# Patient Record
Sex: Male | Born: 1960 | Race: Black or African American | Hispanic: No | Marital: Single | Smoking: Current some day smoker
Health system: Southern US, Community
[De-identification: ages and names within clinical notes are randomized; demographics above are authoritative.]

## PROBLEM LIST (undated history)

## (undated) DIAGNOSIS — I10 Essential (primary) hypertension: Secondary | ICD-10-CM

## (undated) HISTORY — DX: Morbid (severe) obesity due to excess calories: E66.01

## (undated) HISTORY — PX: ANKLE FUSION: SHX881

## (undated) HISTORY — PX: FOOT BONE EXCISION: SUR493

---

## 1999-08-04 ENCOUNTER — Emergency Department (HOSPITAL_COMMUNITY): Admission: EM | Admit: 1999-08-04 | Discharge: 1999-08-05 | Payer: Self-pay | Admitting: Emergency Medicine

## 1999-08-04 ENCOUNTER — Encounter: Payer: Self-pay | Admitting: Emergency Medicine

## 2004-01-09 ENCOUNTER — Emergency Department (HOSPITAL_COMMUNITY): Admission: EM | Admit: 2004-01-09 | Discharge: 2004-01-09 | Payer: Self-pay | Admitting: Emergency Medicine

## 2006-01-02 ENCOUNTER — Encounter: Admission: RE | Admit: 2006-01-02 | Discharge: 2006-01-02 | Payer: Self-pay | Admitting: Occupational Medicine

## 2007-01-21 ENCOUNTER — Emergency Department (HOSPITAL_COMMUNITY): Admission: EM | Admit: 2007-01-21 | Discharge: 2007-01-21 | Payer: Self-pay | Admitting: Emergency Medicine

## 2014-06-16 ENCOUNTER — Emergency Department (INDEPENDENT_AMBULATORY_CARE_PROVIDER_SITE_OTHER)
Admission: EM | Admit: 2014-06-16 | Discharge: 2014-06-16 | Disposition: A | Discharge status: Home / Self Care | Payer: Self-pay | Source: Home / Self Care | Attending: Emergency Medicine | Admitting: Emergency Medicine

## 2014-06-16 ENCOUNTER — Encounter (HOSPITAL_COMMUNITY): Payer: Self-pay | Admitting: Emergency Medicine

## 2014-06-16 DIAGNOSIS — L209 Atopic dermatitis, unspecified: Secondary | ICD-10-CM

## 2014-06-16 DIAGNOSIS — L2089 Other atopic dermatitis: Secondary | ICD-10-CM

## 2014-06-16 DIAGNOSIS — I10 Essential (primary) hypertension: Secondary | ICD-10-CM

## 2014-06-16 HISTORY — DX: Essential (primary) hypertension: I10

## 2014-06-16 LAB — POCT I-STAT, CHEM 8
BUN: 15 mg/dL (ref 6–23)
Calcium, Ion: 1.2 mmol/L (ref 1.12–1.23)
Chloride: 106 mEq/L (ref 96–112)
Creatinine, Ser: 1 mg/dL (ref 0.50–1.35)
Glucose, Bld: 101 mg/dL — ABNORMAL HIGH (ref 70–99)
HCT: 46 % (ref 39.0–52.0)
Hemoglobin: 15.6 g/dL (ref 13.0–17.0)
Potassium: 4 mEq/L (ref 3.7–5.3)
Sodium: 139 mEq/L (ref 137–147)
TCO2: 24 mmol/L (ref 0–100)

## 2014-06-16 MED ORDER — TRIAMCINOLONE ACETONIDE 0.1 % EX CREA: 1.0000 "application " | TOPICAL_CREAM | Freq: Three times a day (TID) | CUTANEOUS | Status: DC

## 2014-06-16 MED ORDER — FUROSEMIDE 20 MG PO TABS: 20.0000 mg | ORAL_TABLET | Freq: Every day | ORAL | Status: DC

## 2014-06-16 MED ORDER — LISINOPRIL-HYDROCHLOROTHIAZIDE 20-25 MG PO TABS: 1.0000 | ORAL_TABLET | Freq: Every day | ORAL | Status: DC

## 2014-06-16 NOTE — ED Provider Notes (Signed)
Chief Complaint   Chief Complaint  Patient presents with  . Medication Refill    History of Present Illness   Cody [name] is a 53 year old male who presented today for refill on blood pressure medications. He is on furosemide 20 mg a day, lisinopril/HCTZ 20/25 once a day and then Tenormin. He has had high blood pressure for about a year. He notes dizziness and swelling of his ankles. He denies headaches, blurry vision, shortness of breath, or chest pain. He's had no strokelike symptoms. He denies any history of diabetes, kidney failure, heart attack, or stroke. He also has eczema in his axillas, groins, and behind his knees. He like a prescription for cream for this. The areas are very itchy.  Review of Systems   Other than as noted above, the patient denies any of the following symptoms: Respiratory:  No coughing, wheezing, or shortness of breath. Cardiac:  No chest pain, tightness, pressure, palpitations, syncope, or edema. Neuro:  No headache, dizziness, blurred vision, weakness, paresthesias, or strokelike symptoms.   PMFSH   Past medical history, family history, social history, meds, and allergies were reviewed.    Physical Examination   Vital signs:  BP 131/83  Pulse 67  Temp(Src) 98.5 F (36.9 C) (Oral)  Resp 18  SpO2 98% General:  Alert, oriented, in no distress. Lungs:  Breath sounds clear and equal bilaterally.  No wheezes, rales, or rhonchi. Heart:  Regular rhythm, no gallops, murmers, clicks or rubs.  Abdomen:  Soft and flat.  Nontender, no organomegaly or mass.  No pulsatile midline abdominal mass or bruit. Ext:  No edema, pulses full. Neurological exam:  Alert and oriented.  Speech is clear.  No pronator drift.  CNs intact.  Labs   Results for orders placed during the hospital encounter of 06/16/14  POCT I-STAT, CHEM 8      Result Value Ref Range   Sodium 139  137 - 147 mEq/L   Potassium 4.0  3.7 - 5.3 mEq/L   Chloride 106  96 - 112 mEq/L   BUN 15  6  - 23 mg/dL   Creatinine, Ser 6.211.00  0.50 - 1.35 mg/dL   Glucose, Bld 308101 (*) 70 - 99 mg/dL   Calcium, Ion 6.571.20  1.12 - 1.23 mmol/L   TCO2 24  0 - 100 mmol/L   Hemoglobin 15.6  13.0 - 17.0 g/dL   HCT 84.646.0  96.239.0 - 95.252.0 %    Assessment   The primary encounter diagnosis was Essential hypertension. A diagnosis of Atopic dermatitis was also pertinent to this visit.  Plan   1.  Meds:  The following meds were prescribed:   Discharge Medication List as of 06/16/2014 11:29 AM    START taking these medications   Details  !! furosemide (LASIX) 20 MG tablet Take 1 tablet (20 mg total) by mouth daily., Starting 06/16/2014, Until Discontinued, Normal    !! lisinopril-hydrochlorothiazide (PRINZIDE,ZESTORETIC) 20-25 MG per tablet Take 1 tablet by mouth daily., Starting 06/16/2014, Until Discontinued, Normal    triamcinolone cream (KENALOG) 0.1 % Apply 1 application topically 3 (three) times daily., Starting 06/16/2014, Until Discontinued, Normal     !! - Potential duplicate medications found. Please discuss with provider.      2.  Patient Education/Counseling:  The patient was given appropriate handouts, self care instructions, and instructed in symptomatic relief. Specifically discussed salt and sodium restriction, weight control, and exercise.   3.  Follow up:  The patient was told to follow up  here if no better in 3 to 4 days, or sooner if becoming worse in any way, and given some red flag symptoms such as severe headache, vision changes, shortness of breath, chest pain or stroke like symptoms which would prompt immediate return.  [facility] for ongoing blood pressure care.    Reuben Likes, MD 06/16/14 [phone]

## 2014-06-16 NOTE — Discharge Instructions (Signed)
Blood pressure over the ideal can put you at higher risk for stroke, heart disease, and kidney failure.  For this reason, it's important to try to get your blood pressure as close as possible to the ideal.  The ideal blood pressure is 120/80.  Blood pressures from 469-629 systolic over 52-84 diastolic are labeled as "prehypertension."  This means you are at higher risk of developing hypertension in the future.  Blood pressures in this range are not treated with medication, but lifestyle changes are recommended to prevent progression to hypertension.  Blood pressures of 132 and above systolic over 90 and above diastolic are classified as hypertension and are treated with medications.  Lifestyle changes which can benefit both prehypertension and hypertension include the following:   Salt and sodium restriction.  Weight loss.  Regular exercise.  Avoidance of tobacco.  Avoidance of excess alcohol.  The "D.A.S.H" diet.   People with hypertension and prehypertension should limit their salt intake to less than 1500 mg daily.  Reading the nutrition information on the label of many prepared foods can give you an idea of how much sodium you're consuming at each meal.  Remember that the most important number on the nutrition information is the serving size.  It may be smaller than you think.  Try to avoid adding extra salt at the table.  You may add small amounts of salt while cooking.  Remember that salt is an acquired taste and you may get used to a using a whole lot less salt than you are using now.  Using less salt lets the food's natural flavors come through.  You might want to consider using salt substitutes, potassium chloride, pepper, or blends of herbs and spices to enhance the flavor of your food.  Foods that contain the most salt include: processed meats (like ham, bacon, lunch meat, sausage, hot dogs, and breakfast meat), chips, pretzels, salted nuts, soups, salty snacks, canned foods, junk  food, fast food, restaurant food, mustard, pickles, pizza, popcorn, soy sauce, and worcestershire sauce--quite a list!  You might ask, "Is there anything I can eat?"  The answer is, "yes."  Fruits and vegetables are usually low in salt.  Fresh is better than frozen which is better than canned.  If you have canned vegetables, you can cut down on the salt content by rinsing them in tap water 3 times before cooking.     Weight loss is the second thing you can do to lower your blood pressure.  Getting to and maintaining ideal weight will often normalize your blood pressure and allow you to avoid medications, entirely, cut way down on your dosage of medications, or allow to wean off your meds.  (Note, this should only be done under the supervision of your primary care doctor.)  Of course, weight loss takes time and you may need to be on medication in the meantime.  You shoot for a body mass index of 20-25.  When you go to the urgent care or to your primary care doctor, they should calculate your BMI.  If you don't know what it is, ask.  You can calculate your BMI with the following formula:  Weight in pounds x 703/ (height in inches) x (height in inches).  There are many good diets out there: Weight Watchers and the D.A.S.H. Diet are the best, but often, just modifying a few factors can be helpful:  Don't skip meals, don't eat out, and keeping a food diary.  I do not recommend  fad diets or diet pills which often raise blood pressure.    Everyone should get regular exercise, but this is particularly important for people with high blood pressure.  Just about any exercise is good.  The only exercise which may be harmful is lifting extreme heavy weights.  I recommend moderate exercise such as walking for 30 minutes 5 days a week.  Going to the gym for a 50 minute workout 3 times a week is also good.  This amounts to 150 minutes of exercise weekly.   Anyone with high blood pressure should avoid any use of tobacco.   Tobacco use does not elevate blood pressure, but it increases the risk of heart disease and stroke.  If you are interested in quitting, discuss with your doctor how to quit.  If you are not interested in quitting, ask yourself, "What would my life be like in 10 years if I continue to smoke?"  "How will I know when it is time to quit?"  "How would my life be better if I were to quit."   Excess alcohol intake can raise the blood pressure.  The safe alcohol intake is 2 drinks or less per day for men and 1 drink per day or less for women.   There is a very good diet which I recommend that has been designed for people with blood pressure called the D.A.S.H. Diet (dietary approaches to stop hypertension).  It consists of fruits, vegetables, lean meats, low fat dairy, whole grains, nuts and seeds.  It is very low in salt and sodium.  It has also been found to have other beneficial health effects such as lowering cholesterol and helping lose weight.  It has been developed by the [employer] and can be downloaded from the internet without any cost. Just do a Programmer, multimedia on "D.A.S.H. Diet." or go the NIH website (GolfingPosters.tn).  There are also cookbooks and diet plans that can be gotten from [place] to help you with this diet.  Eczema has been described as "the itch that rashes."  The primary problem is inflammation of the skin, this is followed by the irresistible urge to scratch.  The scratching is what causes the rash.    Eczema is thought to be hereditary.  It is often accompanied by other inflammatory diseases such as asthma or hay fever.  There are certain environmental things that may make it worse such as dryness of the skin, wool clothing, infection, certain allergens, and stress.  It is important to recognize that it is not caused by stress, and the search for an allergic cause is not helpful.  While there is no cure for eczema, it can be controlled with prescription medication and  lifestyle changes. Suggestions follow for successful control of eczema:   Avoid harsh soaps.  Use Dove, Cetaphil, Neutrogena, Aveeno.  Do not use [place].  Take infrequent baths or showers, use luke warm water.  Get in and out of the bath or shower as quickly as possible.  Do not scrub the skin. Pat the skin dry after bathing.    Immediately after bathing apply a skin moisturizer such as Aquaphor, Vaseline, Eucerin, Cetaphil, Nutraderm, or Nivea. These should be applied 2 times a day, immediately after bathing and after all hand washing.   Use unscented laundry detergent such as Cheer-free, All-free, or unscented Bounce since these have no perfumes or preservatives.   Keep thermostat as low as possible in winter.  Consider a humidifier in  bedroom.    Avoid wool clothing, blended or synthetic fabric or shirt collar tags--use  100% cotton clothing instead.   Avoid scratching.  Try using an ice cube on  Itchy area.   May use antihistamines such as Benadryl, Zyrtec, Allegra, or Claritin for itching.   Treat skin infections immediately.  Eczema is a chronic and recurring condition.  You should be followed by a primary care doctor or a dermatology specialist or both.

## 2014-06-16 NOTE — ED Notes (Signed)
Here for refill on medications Lisinopril-hctz Lasix Phentermine Eczema med  States he is from [place] and has moved here since mom passed

## 2014-06-20 ENCOUNTER — Ambulatory Visit: Payer: Self-pay | Admitting: Internal Medicine

## 2014-06-29 ENCOUNTER — Ambulatory Visit: Payer: Self-pay | Attending: Internal Medicine | Admitting: Internal Medicine

## 2014-06-29 ENCOUNTER — Encounter: Payer: Self-pay | Admitting: Internal Medicine

## 2014-06-29 DIAGNOSIS — I10 Essential (primary) hypertension: Secondary | ICD-10-CM | POA: Insufficient documentation

## 2014-06-29 DIAGNOSIS — Z6841 Body Mass Index (BMI) 40.0 and over, adult: Secondary | ICD-10-CM | POA: Insufficient documentation

## 2014-06-29 DIAGNOSIS — Z713 Dietary counseling and surveillance: Secondary | ICD-10-CM | POA: Insufficient documentation

## 2014-06-29 NOTE — Progress Notes (Signed)
Establish care Pt stated has Hx HTN,  Medicine refill  Rx phentamine

## 2014-06-29 NOTE — Progress Notes (Signed)
Patient ID: Cody [name], male   DOB: December 10, 1960, 53 y.o.   MRN: [id number]  YQM:[id number]  [id number]  DOB - [birth year]  CC:  Chief Complaint  Patient presents with  . Establish Care       HPI: Cody [name] is a 53 y.o. male here today to establish medical care.  Patient reports that he has a history of hypertension and was given lasix due to excess edema.  Patient reports that his edema has improved.  He denies tobacco use and only social alcohol use. He reports that he has been on Phentermine since January and has lost some weight.  He reports very limited exercise due to joint pain.  He has not tried cutting back on certain foods or dieting.  He states that he has been more stressed since not being able to get a job and believes it is contributing to his weight gain.                                    No Known Allergies Past Medical History  Diagnosis Date  . Hypertension    Current Outpatient Prescriptions on File Prior to Visit  Medication Sig Dispense Refill  . furosemide (LASIX) 20 MG tablet Take 20 mg by mouth daily.      . furosemide (LASIX) 20 MG tablet Take 1 tablet (20 mg total) by mouth daily.  30 tablet  3  . lisinopril-hydrochlorothiazide (PRINZIDE,ZESTORETIC) 20-25 MG per tablet Take 1 tablet by mouth daily.      Marland Kitchen lisinopril-hydrochlorothiazide (PRINZIDE,ZESTORETIC) 20-25 MG per tablet Take 1 tablet by mouth daily.  30 tablet  3  . phentermine 37.5 MG capsule Take 37.5 mg by mouth every morning.      . triamcinolone cream (KENALOG) 0.1 % Apply 1 application topically 3 (three) times daily.  454 g  3   No current facility-administered medications on file prior to visit.   History reviewed. No pertinent family history. History   Social History  . Marital Status: Single    Spouse Name: N/A    Number of Children: N/A  . Years of Education: N/A   Occupational History  . Not on file.   Social History Main Topics  . Smoking status: Never Smoker   .  Smokeless tobacco: Not on file  . Alcohol Use: Yes  . Drug Use: Not on file  . Sexual Activity: Not on file   Other Topics Concern  . Not on file   Social History Narrative  . No narrative on file    Review of Systems: Constitutional: Negative for fever, chills, diaphoresis, activity change, appetite change and fatigue. HENT: Negative for ear pain, nosebleeds, congestion, facial swelling, rhinorrhea, neck pain, neck stiffness and ear discharge.  Eyes: Negative for pain, discharge, redness, itching and visual disturbance. Respiratory: Negative for cough, choking, chest tightness, shortness of breath, wheezing and stridor.  Cardiovascular: Negative for chest pain, palpitations and leg swelling. Gastrointestinal: Negative for abdominal distention. Genitourinary: Negative for dysuria, urgency, frequency, hematuria, flank pain, decreased urine volume, difficulty urinating and dyspareunia.  Musculoskeletal: Negative for back pain, joint swelling, arthralgia and gait problem. Neurological: Negative for dizziness, tremors, seizures, syncope, facial asymmetry, speech difficulty, weakness, light-headedness, numbness and headaches.  Hematological: Negative for adenopathy. Does not bruise/bleed easily. Psychiatric/Behavioral: Negative for hallucinations, behavioral problems, confusion, dysphoric mood, decreased concentration and agitation.    Objective:   Filed Vitals:   06/29/14  1451  BP: 120/76  Pulse: 105  Temp: 99 F (37.2 C)  Resp: 20    Physical Exam: Constitutional: Patient appears well-developed and well-nourished. No distress. HENT: Normocephalic, atraumatic, External right and left ear normal. Oropharynx is clear and moist.  Eyes: Conjunctivae and EOM are normal. PERRLA, no scleral icterus. Neck: Normal ROM. Neck supple. No JVD. No tracheal deviation. No thyromegaly. CVS: RRR, S1/S2 +, no murmurs, no gallops, no carotid bruit.  Pulmonary: Effort normal. Lung sounds are  diminished as a result of body habitus.  Abdominal: Soft. BS +, no distension, tenderness, rebound or guarding. Unable to palpate liver margins due to body habitus.  Musculoskeletal: Normal range of motion. No edema and no tenderness.  Lymphadenopathy: No lymphadenopathy noted, cervical, inguinal or axillary Neuro: Alert. Normal reflexes, muscle tone coordination. No cranial nerve deficit. Skin: Skin is warm and dry. No rash noted. Not diaphoretic. No erythema. No pallor. Psychiatric: Normal mood and affect. Behavior, judgment, thought content normal.  Lab Results  Component Value Date   HGB 15.6 06/16/2014   HCT 46.0 06/16/2014   Lab Results  Component Value Date   CREATININE 1.00 06/16/2014   BUN 15 06/16/2014   NA 139 06/16/2014   K 4.0 06/16/2014   CL 106 06/16/2014    No results found for this basename: HGBA1C   Lipid Panel  No results found for this basename: chol, trig, hdl, cholhdl, vldl, ldlcalc       Assessment and plan:   Cody [name] was seen today for establish care.  Diagnoses and associated orders for this visit:  Morbidly obese - Amb Referral to Nutrition and Diabetic Education.  Explained to patient that d/t his history of hypertension I do not feel comfortable giving him phentermine.  Explained that he should attend nutrition classes and attempt to control his diet before resulting to pharmacological measures. Spent 20 minutes counseling patient on how to control diet, foods he should limit, and exercise.  Advised patient to start out with walking or swimming for ease of pressure on joints   If phentermine is given it will only be for 3 months and he must have significant weight loss during this time.    Essential hypertension Continue current medications.      Holland Commons, NP-C [facility] [phone] 07/03/2014, 3:07 PM

## 2014-07-01 ENCOUNTER — Telehealth: Payer: Self-pay | Admitting: Internal Medicine

## 2014-07-01 NOTE — Telephone Encounter (Signed)
Pt. Called asking to speak to Lyons, CMA. Pt. Was told that CHW needed to put in a note for nurse to call back. Pt. Hung up the phone before call could be routed to nurse.

## 2014-07-04 ENCOUNTER — Encounter: Payer: Self-pay | Attending: Internal Medicine | Admitting: *Deleted

## 2014-07-04 ENCOUNTER — Encounter: Payer: Self-pay | Admitting: *Deleted

## 2014-07-04 VITALS — Ht 75.0 in | Wt >= 6400 oz

## 2014-07-04 DIAGNOSIS — Z6841 Body Mass Index (BMI) 40.0 and over, adult: Secondary | ICD-10-CM | POA: Insufficient documentation

## 2014-07-04 DIAGNOSIS — Z713 Dietary counseling and surveillance: Secondary | ICD-10-CM | POA: Insufficient documentation

## 2014-07-04 DIAGNOSIS — E669 Obesity, unspecified: Secondary | ICD-10-CM | POA: Insufficient documentation

## 2014-07-04 NOTE — Progress Notes (Signed)
  Medical Nutrition Therapy:  Appt start time: 1030 end time:  1130.   Assessment:  Pt. Here today for weight management counseling. Says he was on Phentermine in the past and lost a lot of weight by walking and not eating after 6 pm. He lost about 100 lbs total. He since then has regained the weight back and then some. Currently not working and very sedentary.  He buys a lot of his food at [employer] and eats a lot of canned and processed meats and junk food. Majority of snacking is in the evening. He is motivated to make health changes and likes to eat and cook fresh fruits and vegetables. He has a garden. [place] close to a track and plans on starting to walk again.   MEDICATIONS: See list below   DIETARY INTAKE:   Usual eating pattern includes 2 meals and 2-3 snacks per day.  24-hr recall:  B (11:30-12 AM): Wheat bread 2 slices, 2 eggs, 1 tomato, salt/pepper, 16 ounces tomato juice/water Snk ( AM): none  L ( PM): usually skips Snk ( PM): none D (4 PM): Vegetables from garden (corn, tomatoes), wheat bread 2 slices, 4 oz canned ham (dollar store), Crystal light (2 packets) Snk (8:30-9 PM): 2 cans [place] sausages, 2 medium bags of potato chips, sweets (cookies, Helene Shoe) Beverages: Crystal light, water 3-4 liters per day  Usual physical activity: Mowing yard 1 x per week (1 acre)  Estimated energy needs: 2200 calories 275 g carbohydrates 138 g protein 61 g fat  Progress Towards Goal(s):  In progress.   Nutritional Diagnosis:  [place]-3.3 Overweight/obesity As related to excessive energy intake and sedetary lifestyle.  As evidenced by morbid obesity with BMI greater than 40.    Intervention:  Nutrition Counseling, We discussed strategies for weight loss, including balancing nutrients (carbs, protein, fat), portion control, healthy snacks, and exercise.   Goals: 1. 1-2 lbs weight loss per week. 2. Meal plan using Plate Method with limiting starches and increasing fresh fruits and  vegetables 3. Choose healthier snacks: popcorn, fruit or vegetables. 4. Exercise 15-30 minutes most days of the week by walking at the track.  Handouts given during visit include:  Weight loss tips  Meal Plan Card  Monitoring/Evaluation:  Dietary intake, exercise, weight loss tips, meal planning, portion sizes, and body weight in 1 month(s).

## 2014-08-08 ENCOUNTER — Ambulatory Visit: Payer: Self-pay | Admitting: *Deleted

## 2014-08-15 ENCOUNTER — Ambulatory Visit: Payer: Self-pay | Admitting: Dietician

## 2015-10-20 ENCOUNTER — Encounter: Payer: Self-pay | Admitting: Internal Medicine

## 2015-10-20 ENCOUNTER — Ambulatory Visit (INDEPENDENT_AMBULATORY_CARE_PROVIDER_SITE_OTHER): Payer: Self-pay | Admitting: Internal Medicine

## 2015-10-20 VITALS — BP 136/84 | HR 82 | Ht 75.0 in

## 2015-10-20 DIAGNOSIS — M722 Plantar fascial fibromatosis: Secondary | ICD-10-CM | POA: Insufficient documentation

## 2015-10-20 DIAGNOSIS — I10 Essential (primary) hypertension: Secondary | ICD-10-CM | POA: Insufficient documentation

## 2015-10-20 MED ORDER — TRIAMCINOLONE ACETONIDE 0.1 % EX CREA: TOPICAL_CREAM | CUTANEOUS | Status: DC

## 2015-10-20 NOTE — Progress Notes (Signed)
   Subjective:    Patient ID: Cody [name], male    DOB: 05/22/1961, 54 y.o.   MRN: [id number]  HPI   Plantar fasciitis:  [facility] [employer], but only received 2 month scholarship in Sept/Oct.  Was swimming and doing cardio, walked in the pool --3 feet level--as discussed.  Did have improvement with pool exercises, but pain worse after had to discontinue.  Pt. Not clear about why his membership was not continued--he is working on it. He did not have any significant weight loss and had not maintained the weight loss he had. Is here as he would like to discuss podiatry referral at this point.  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 States he is doing stretches still, but considers all of this a temporary fix. Is not using heel cups as recommended--states they wore out and did not replace.    Review of Systems     Objective:   Physical Exam  Tender mainly on plantar surface of heels bilaterally.  Some tenderness in arch and dorsal foot as well      Assessment & Plan:  1.  Plantar Fasciitis:  Referral to podiatry  2.  HM:  Had CPE already this year.  Has not heard about colonoscopy

## 2015-10-20 NOTE — Patient Instructions (Addendum)
[facility] [facility] [employer] or [employer] for Heel Cups--need to have in all your shoes Use frozen water in bottle or large can of veggies to roll back and forth on the floor with your bare foot when you are sitting during the day.

## 2015-12-11 ENCOUNTER — Ambulatory Visit: Payer: Self-pay | Attending: Podiatry

## 2015-12-11 ENCOUNTER — Other Ambulatory Visit: Payer: Self-pay | Admitting: Podiatry

## 2015-12-11 MED ORDER — MELOXICAM 15 MG PO TABS: 15.0000 mg | ORAL_TABLET | Freq: Every day | ORAL | Status: DC

## 2015-12-14 ENCOUNTER — Encounter: Payer: Self-pay | Admitting: Internal Medicine

## 2015-12-14 ENCOUNTER — Ambulatory Visit (INDEPENDENT_AMBULATORY_CARE_PROVIDER_SITE_OTHER): Payer: Self-pay | Admitting: Internal Medicine

## 2015-12-14 VITALS — Ht 75.0 in

## 2015-12-14 DIAGNOSIS — Z5321 Procedure and treatment not carried out due to patient leaving prior to being seen by health care provider: Secondary | ICD-10-CM

## 2015-12-18 NOTE — Progress Notes (Signed)
   Subjective:    Patient ID: Cody [name], male    DOB: December 21, 1960, 55 y.o.   MRN: [id number]  HPI    Review of Systems     Objective:   Physical Exam        Assessment & Plan:  Patient left without being seen after roomed by nurse.

## 2015-12-21 ENCOUNTER — Telehealth: Payer: Self-pay | Admitting: *Deleted

## 2015-12-21 MED ORDER — MELOXICAM 15 MG PO TABS: 15.0000 mg | ORAL_TABLET | Freq: Every day | ORAL | Status: DC

## 2015-12-21 MED FILL — MELOXICAM 15 MG TABLET: 15 | 30 days supply | Qty: 30 | Fill #0

## 2015-12-28 ENCOUNTER — Ambulatory Visit: Payer: Self-pay | Admitting: Internal Medicine

## 2015-12-29 ENCOUNTER — Ambulatory Visit (INDEPENDENT_AMBULATORY_CARE_PROVIDER_SITE_OTHER): Payer: No Typology Code available for payment source

## 2015-12-29 ENCOUNTER — Ambulatory Visit (INDEPENDENT_AMBULATORY_CARE_PROVIDER_SITE_OTHER): Payer: No Typology Code available for payment source | Admitting: Podiatry

## 2015-12-29 ENCOUNTER — Encounter: Payer: Self-pay | Admitting: Podiatry

## 2015-12-29 VITALS — BP 132/77 | HR 88 | Resp 18

## 2015-12-29 DIAGNOSIS — R52 Pain, unspecified: Secondary | ICD-10-CM

## 2015-12-29 DIAGNOSIS — M722 Plantar fascial fibromatosis: Secondary | ICD-10-CM

## 2015-12-29 DIAGNOSIS — M19079 Primary osteoarthritis, unspecified ankle and foot: Secondary | ICD-10-CM

## 2015-12-29 DIAGNOSIS — B351 Tinea unguium: Secondary | ICD-10-CM

## 2015-12-29 MED ORDER — METHYLPREDNISOLONE 4 MG PO TBPK: ORAL_TABLET | ORAL | Status: DC

## 2015-12-29 NOTE — Progress Notes (Signed)
Subjective:    Patient ID: Cody [name], male    DOB: [birth year], 55 y.o.   MRN: [id number]  HPI  55 year old male presents the office with concerns of bilateral heel pain which has been ongoing for greater than one year. He states that he's been taking the meloxicam which has not been helping. [facility] He follows up the office today to see with elected proceed with orthotics. He has tried changing shoes he feels that there is not good support. Denies any recent injury or trauma. No swelling or redness. No tingling or numbness. He describes as a throbbing sensation the bottom of his heel. The pain does not wake him up at night. No other complaints at this time.  Review of Systems  All other systems reviewed and are negative.      Objective:   Physical Exam General: AAO x3, NAD  Dermatological: Skin is warm, dry and supple bilateral. Nails x 10 are well manicured; remaining integument appears unremarkable at this time. There are no open sores, no preulcerative lesions, no rash or signs of infection present.  Vascular: Dorsalis Pedis artery and Posterior Tibial artery pedal pulses are 2/4 bilateral with immedate capillary fill time. Pedal hair growth present. No varicosities and no lower extremity edema present bilateral. There is no pain with calf compression, swelling, warmth, erythema.   Neruologic: Grossly intact via light touch bilateral. Vibratory intact via tuning fork bilateral. Protective threshold with Semmes Wienstein monofilament intact to all pedal sites bilateral. Patellar and Achilles deep tendon reflexes 2+ bilateral. No Babinski or clonus noted bilateral. Negative tinel sign.   Musculoskeletal: There is a decrease in medial arch height upon weightbearing present ankle joint range of motion is intact without any pain. There is decreased range of motion of the subtalar joint bilaterally. Midfoot range of motion also  decreased. MPJ range of motion intact. There is tenderness palpation upon the plantar medial tubercle of the calcaneus at the insertion the plantar fascial bilaterally. There is mild discomfort along the course of plantar fascial the arch of the foot along the medial band. There is no pain with medial to lateral compression of the calcaneus. There is no other areas of tenderness to bilateral lower extremities. There is no overlying edema, erythema, increase in warmth. MMT 5/5, ROM WNL.  Gait: Unassisted, Nonantalgic.      Assessment & Plan:  55 year old male bilateral heel pain, likely plantar fasciitis and also arthritis of the foot. -Treatment options discussed including all alternatives, risks, and complications -X-rays were obtained and reviewed with the patient. Evidence of flatfoot deformity as well as arthritic changes of the midfoot and rear foot. There is no definitive evidence of acute fracture stress fracture at this time. -Heel to proceed with orthotics today. He was scanned for orthotics and they were sent to [facility] labs.  -Patient elects to proceed with steroid injection into the bilateral heels. Under sterile skin preparation, a total of 2.5cc of kenalog 10, 0.5% Marcaine plain, and 2% lidocaine plain were infiltrated into the symptomatic area without complication. A band-aid was applied. Patient tolerated the injection well without complication. Post-injection care with discussed with the patient. Discussed with the patient to ice the area over the next couple of days to help prevent a steroid flare.  -Discussed shoe gear modifications. -Stretching icing exercises daily. -Follow-up in 3 weeks to [facility] or sooner if any problems arise. In the meantime, encouraged to call the office  with any questions, concerns, change in symptoms.   Celesta Gentile, DPM

## 2016-01-01 ENCOUNTER — Encounter: Payer: Self-pay | Admitting: Podiatry

## 2016-01-01 DIAGNOSIS — M722 Plantar fascial fibromatosis: Secondary | ICD-10-CM | POA: Insufficient documentation

## 2016-01-01 DIAGNOSIS — M19079 Primary osteoarthritis, unspecified ankle and foot: Secondary | ICD-10-CM | POA: Insufficient documentation

## 2016-01-06 ENCOUNTER — Encounter: Payer: Self-pay | Admitting: Internal Medicine

## 2016-01-10 ENCOUNTER — Encounter (HOSPITAL_COMMUNITY): Payer: Self-pay | Admitting: Emergency Medicine

## 2016-01-10 ENCOUNTER — Emergency Department (INDEPENDENT_AMBULATORY_CARE_PROVIDER_SITE_OTHER): Payer: No Typology Code available for payment source

## 2016-01-10 ENCOUNTER — Emergency Department (INDEPENDENT_AMBULATORY_CARE_PROVIDER_SITE_OTHER)
Admission: EM | Admit: 2016-01-10 | Discharge: 2016-01-10 | Disposition: A | Discharge status: Home / Self Care | Payer: No Typology Code available for payment source | Source: Home / Self Care | Attending: Family Medicine | Admitting: Family Medicine

## 2016-01-10 DIAGNOSIS — J9801 Acute bronchospasm: Secondary | ICD-10-CM

## 2016-01-10 DIAGNOSIS — J111 Influenza due to unidentified influenza virus with other respiratory manifestations: Secondary | ICD-10-CM

## 2016-01-10 MED ORDER — ALBUTEROL SULFATE HFA 108 (90 BASE) MCG/ACT IN AERS: 2.0000 | INHALATION_SPRAY | RESPIRATORY_TRACT | Status: DC | PRN

## 2016-01-10 MED ORDER — IPRATROPIUM-ALBUTEROL 0.5-2.5 (3) MG/3ML IN SOLN
RESPIRATORY_TRACT | Status: AC
  Filled 2016-01-10: qty 3

## 2016-01-10 MED ORDER — PREDNISONE 20 MG PO TABS: ORAL_TABLET | ORAL | Status: DC

## 2016-01-10 MED ORDER — IPRATROPIUM-ALBUTEROL 0.5-2.5 (3) MG/3ML IN SOLN
3.0000 mL | Freq: Once | RESPIRATORY_TRACT | Status: AC
  Administered 2016-01-10: 3 mL via RESPIRATORY_TRACT

## 2016-01-10 MED ORDER — METHYLPREDNISOLONE SODIUM SUCC 125 MG IJ SOLR
125.0000 mg | Freq: Once | INTRAMUSCULAR | Status: AC
  Administered 2016-01-10: 125 mg via INTRAMUSCULAR

## 2016-01-10 MED ORDER — METHYLPREDNISOLONE SODIUM SUCC 125 MG IJ SOLR
INTRAMUSCULAR | Status: AC
  Filled 2016-01-10: qty 2

## 2016-01-10 MED ORDER — ALBUTEROL SULFATE (2.5 MG/3ML) 0.083% IN NEBU
INHALATION_SOLUTION | RESPIRATORY_TRACT | Status: AC
  Filled 2016-01-10: qty 3

## 2016-01-10 MED ORDER — ALBUTEROL SULFATE (2.5 MG/3ML) 0.083% IN NEBU
2.5000 mg | INHALATION_SOLUTION | Freq: Once | RESPIRATORY_TRACT | Status: AC
  Administered 2016-01-10: 2.5 mg via RESPIRATORY_TRACT

## 2016-01-10 NOTE — Discharge Instructions (Signed)
Bronchospasm, Adult Albuterol HFA 2 puffs every forward as needed for call spasms and wheezing Prednisone taper dose as needed. A bronchospasm is a spasm or tightening of the airways going into the lungs. During a bronchospasm breathing becomes more difficult because the airways get smaller. When this happens there can be coughing, a whistling sound when breathing (wheezing), and difficulty breathing. Bronchospasm is often associated with asthma, but not all patients who experience a bronchospasm have asthma. CAUSES  A bronchospasm is caused by inflammation or irritation of the airways. The inflammation or irritation may be triggered by:   Allergies (such as to animals, pollen, food, or mold). Allergens that cause bronchospasm may cause wheezing immediately after exposure or many hours later.   Infection. Viral infections are believed to be the most common cause of bronchospasm.   Exercise.   Irritants (such as pollution, cigarette smoke, strong odors, aerosol sprays, and paint fumes).   Weather changes. Winds increase molds and pollens in the air. Rain refreshes the air by washing irritants out. Cold air may cause inflammation.   Stress and emotional upset.  SIGNS AND SYMPTOMS   Wheezing.   Excessive nighttime coughing.   Frequent or severe coughing with a simple cold.   Chest tightness.   Shortness of breath.  DIAGNOSIS  Bronchospasm is usually diagnosed through a history and physical exam. Tests, such as chest X-rays, are sometimes done to look for other conditions. TREATMENT   Inhaled medicines can be given to open up your airways and help you breathe. The medicines can be given using either an inhaler or a nebulizer machine.  Corticosteroid medicines may be given for severe bronchospasm, usually when it is associated with asthma. HOME CARE INSTRUCTIONS   Always have a plan prepared for seeking medical care. Know when to call your health care provider and local  emergency services (911 in the [place] Know where you can access local emergency care.  Only take medicines as directed by your health care provider.  If you were prescribed an inhaler or nebulizer machine, ask your health care provider to explain how to use it correctly. Always use a spacer with your inhaler if you were given one.  It is necessary to remain calm during an attack. Try to relax and breathe more slowly.  Control your home environment in the following ways:   Change your heating and air conditioning filter at least once a month.   Limit your use of fireplaces and wood stoves.  Do not smoke and do not allow smoking in your home.   Avoid exposure to perfumes and fragrances.   Get rid of pests (such as roaches and mice) and their droppings.   Throw away plants if you see mold on them.   Keep your house clean and dust free.   Replace carpet with wood, tile, or vinyl flooring. Carpet can trap dander and dust.   Use allergy-proof pillows, mattress covers, and box spring covers.   Wash bed sheets and blankets every week in hot water and dry them in a dryer.   Use blankets that are made of polyester or cotton.   Wash hands frequently. SEEK MEDICAL CARE IF:   You have muscle aches.   You have chest pain.   The sputum changes from clear or white to yellow, green, gray, or bloody.   The sputum you cough up gets thicker.   There are problems that may be related to the medicine you are given, such as a rash, itching,  swelling, or trouble breathing.  SEEK IMMEDIATE MEDICAL CARE IF:   You have worsening wheezing and coughing even after taking your prescribed medicines.   You have increased difficulty breathing.   You develop severe chest pain. MAKE SURE YOU:   Understand these instructions.  Will watch your condition.  Will get help right away if you are not doing well or get worse.   This information is not intended to replace advice given  to you by your health care provider. Make sure you discuss any questions you have with your health care provider.   Document Released: 10/24/2003 Document Revised: 11/11/2014 Document Reviewed: 04/12/2013 Elsevier Interactive Patient Education 2016 Else[employer] Inc[employer]  Influenza, Adult For nasal and head congestion may take Sudafed PE 10 mg every 4 hours as needed. Saline nasal spray used frequently. For drainage may use Allegra, Claritin or Zyrtec. If you need stronger medicine to stop drainage may take Chlor-Trimeton 2-4 mg every 4 hours. This may cause drowsiness. Ibuprofen 600 mg every 6 hours as needed for pain, discomfort or fever. Drink plenty of fluids and stay well-hydrated.  Influenza ("the flu") is a viral infection of the respiratory tract. It occurs more often in winter months because people spend more time in close contact with one another. Influenza can make you feel very sick. Influenza easily spreads from person to person (contagious). CAUSES  Influenza is caused by a virus that infects the respiratory tract. You can catch the virus by breathing in droplets from an infected person's cough or sneeze. You can also catch the virus by touching something that was recently contaminated with the virus and then touching your mouth, nose, or eyes. RISKS AND COMPLICATIONS You may be at risk for a more severe case of influenza if you smoke cigarettes, have diabetes, have chronic heart disease (such as heart failure) or lung disease (such as asthma), or if you have a weakened immune system. Elderly people and pregnant women are also at risk for more serious infections. The most common problem of influenza is a lung infection (pneumonia). Sometimes, this problem can require emergency medical care and may be life threatening. SIGNS AND SYMPTOMS  Symptoms typically last 4 to 10 days and may include:  Fever.  Chills.  Headache, body aches, and muscle aches.  Sore throat.  Chest discomfort and  cough.  Poor appetite.  Weakness or feeling tired.  Dizziness.  Nausea or vomiting. DIAGNOSIS  Diagnosis of influenza is often made based on your history and a physical exam. A nose or throat swab test can be done to confirm the diagnosis. TREATMENT  In mild cases, influenza goes away on its own. Treatment is directed at relieving symptoms. For more severe cases, your health care provider may prescribe antiviral medicines to shorten the sickness. Antibiotic medicines are not effective because the infection is caused by a virus, not by bacteria. HOME CARE INSTRUCTIONS  Take medicines only as directed by your health care provider.  Use a cool mist humidifier to make breathing easier.  Get plenty of rest until your temperature returns to normal. This usually takes 3 to 4 days.  Drink enough fluid to keep your urine clear or pale yellow.  Cover yourmouth and nosewhen coughing or sneezing,and wash your handswellto prevent thevirusfrom spreading.  Stay homefromwork orschool untilthe fever is gonefor at least 43full day. PREVENTION  An annual influenza vaccination (flu shot) is the best way to avoid getting influenza. An annual flu shot is now routinely recommended for all adults in  the [place] SEEK MEDICAL CARE IF:  You experiencechest pain, yourcough worsens,or you producemore mucus.  Youhave nausea,vomiting, ordiarrhea.  Your fever returns or gets worse. SEEK IMMEDIATE MEDICAL CARE IF:  You havetrouble breathing, you become short of breath,or your skin ornails becomebluish.  You have severe painor stiffnessin the neck.  You develop a sudden headache, or pain in the face or ear.  You have nausea or vomiting that you cannot control. MAKE SURE YOU:   Understand these instructions.  Will watch your condition.  Will get help right away if you are not doing well or get worse.   This information is not intended to replace advice given to you by your  health care provider. Make sure you discuss any questions you have with your health care provider.   Document Released: 10/18/2000 Document Revised: 11/11/2014 Document Reviewed: 01/20/2012 Elsevier Interactive Patient Education [employer]

## 2016-01-10 NOTE — ED Provider Notes (Signed)
CSN: [id number]     Arrival date & time 01/10/16  1510 History   First MD Initiated Contact with Patient 01/10/16 1736     Chief Complaint  Patient presents with  . Influenza   (Consider location/radiation/quality/duration/timing/severity/associated sxs/prior Treatment) HPI Comments: 55 year old severely and morbidly obese male presents with fever, body aches, sinus and chest congestion, runny nose, stuffy nose, headache, cough, dyspnea, fatigue and malaise. He states his temperature home was about 102.   Past Medical History  Diagnosis Date  . Hypertension    History reviewed. No pertinent past surgical history. No family history on file. Social History  Substance Use Topics  . Smoking status: Never Smoker   . Smokeless tobacco: Never Used  . Alcohol Use: 0.0 oz/week    0 Standard drinks or equivalent per week    Review of Systems  Constitutional: Positive for fever, activity change and fatigue. Negative for diaphoresis.  HENT: Positive for congestion, postnasal drip, rhinorrhea, sore throat and trouble swallowing. Negative for ear pain and facial swelling.   Eyes: Negative for pain, discharge and redness.  Respiratory: Positive for cough and shortness of breath. Negative for chest tightness.   Cardiovascular: Negative.   Gastrointestinal: Negative.   Musculoskeletal: Negative.  Negative for neck pain and neck stiffness.  Skin: Negative for rash.  Neurological: Negative.     Allergies  Review of patient's allergies indicates no known allergies.  Home Medications   Prior to Admission medications   Medication Sig Start Date End Date Taking? Authorizing Provider  albuterol (PROVENTIL HFA;VENTOLIN HFA) 108 (90 Base) MCG/ACT inhaler Inhale 2 puffs into the lungs every 4 (four) hours as needed for wheezing or shortness of breath. 01/10/16   Hayden Rasmussen, NP  furosemide (LASIX) 20 MG tablet Take 20 mg by mouth daily.    Historical Provider, MD  lisinopril-hydrochlorothiazide  (PRINZIDE,ZESTORETIC) 20-25 MG per tablet Take 1 tablet by mouth daily. Reported on 12/14/2015    Historical Provider, MD  meloxicam (MOBIC) 15 MG tablet Take 1 tablet (15 mg total) by mouth daily. 12/21/15   Vivi Barrack, DPM  naproxen sodium (ANAPROX) 220 MG tablet Take 220 mg by mouth 2 (two) times daily with a meal. Reported on 12/14/2015    Historical Provider, MD  predniSONE (DELTASONE) 20 MG tablet 3 Tabs PO Days 1-3, then 2 tabs PO Days 4-6, then 1 tab PO Day 7-9, then Half Tab PO Day 10-12 01/10/16   Hayden Rasmussen, NP  triamcinolone cream (KENALOG) 0.1 % Use topically twice daily to affected areas as needed 10/20/15   Julieanne Manson, MD   Meds Ordered and Administered this Visit   Medications  ipratropium-albuterol (DUONEB) 0.5-2.5 (3) MG/3ML nebulizer solution 3 mL (3 mLs Nebulization Given 01/10/16 1824)  albuterol (PROVENTIL) (2.5 MG/3ML) 0.083% nebulizer solution 2.5 mg (2.5 mg Nebulization Given 01/10/16 1824)  methylPREDNISolone sodium succinate (SOLU-MEDROL) 125 mg/2 mL injection 125 mg (125 mg Intramuscular Given 01/10/16 1824)    BP 133/86 mmHg  Pulse 91  Temp(Src) 99.1 F (37.3 C) (Oral)  Resp 16  SpO2 95% No data found.   Physical Exam  Constitutional: He is oriented to person, place, and time. He appears well-developed and well-nourished. No distress.  HENT:  Right TM is normal. Left TM with minor erythema along the superior border. Left TM retracted. Oropharynx with minor erythema and clear PND. No exudates  Eyes: Conjunctivae and EOM are normal.  Neck: Normal range of motion. Neck supple.  Cardiovascular: Normal rate, regular rhythm and normal heart  sounds.   Pulmonary/Chest: No respiratory distress. He has wheezes.  Prolonged expiratory phase. Bilateral diffuse coarseness and wheezing. Some inspiratory wheezing.  Musculoskeletal: He exhibits no edema or tenderness.  Lymphadenopathy:    He has no cervical adenopathy.  Neurological: He is alert and oriented to  person, place, and time. He exhibits normal muscle tone.  Skin: Skin is warm and dry.  Nursing note and vitals reviewed.   ED Course  Procedures (including critical care time)  Labs Review Labs Reviewed - No data to display  Imaging Review Dg Chest 2 View  01/10/2016  CLINICAL DATA:  Sick for 3 days, cough, hoarseness, fever, chest congestion EXAM: CHEST  2 VIEW COMPARISON:  01/02/2006 FINDINGS: Cardiomediastinal silhouette is unremarkable. No acute infiltrate or pleural effusion. No pulmonary edema. Mild degenerative changes mid thoracic spine. IMPRESSION: No active cardiopulmonary disease. Electronically Signed   By: Natasha MeadLiviu  Pop M.D.   On: 01/10/2016 18:13     Visual Acuity Review  Right Eye Distance:   Left Eye Distance:   Bilateral Distance:    Right Eye Near:   Left Eye Near:    Bilateral Near:         MDM   1. Influenza   2. Bronchospasm    Post DuoNeb 5/2.5 mg patient has much subjective improvement and breathing and diminished call. Auscultation reveals decrease in wheezing and increased air movement. He is to continue using albuterol HFA 2 puffs every 4 hours as needed. Prednisone as directed. Received Solu-Medrol 125 mg IM. For nasal and head congestion may take Sudafed PE 10 mg every 4 hours as needed. Saline nasal spray used frequently. For drainage may use Allegra, Claritin or Zyrtec. If you need stronger medicine to stop drainage may take Chlor-Trimeton 2-4 mg every 4 hours. This may cause drowsiness. Ibuprofen 600 mg every 6 hours as needed for pain, discomfort or fever. Drink plenty of fluids and stay well-hydrated. Albuterol HFA 2 puffs every forward as needed for call spasms and wheezing Prednisone taper dose as needed.     Hayden Rasmussenavid Jabar Krysiak, NP 01/10/16 1859

## 2016-01-10 NOTE — ED Notes (Signed)
Flu-cough, chest congestion, nasal congestion, wheezing and body aches.  100-101 fever per patient .  Reports onset of symptoms two days ago

## 2016-01-12 MED FILL — predniSONE 20 MG TABS: 20 | 12 days supply | Qty: 20 | Fill #0

## 2016-01-12 MED FILL — !VENTOLIN HFA INHALER: 108 (90 BAS | 25 days supply | Qty: 18 | Fill #0

## 2016-01-22 ENCOUNTER — Encounter: Payer: Self-pay | Admitting: Podiatry

## 2016-01-22 ENCOUNTER — Ambulatory Visit (INDEPENDENT_AMBULATORY_CARE_PROVIDER_SITE_OTHER): Payer: No Typology Code available for payment source | Admitting: Podiatry

## 2016-01-22 DIAGNOSIS — M722 Plantar fascial fibromatosis: Secondary | ICD-10-CM

## 2016-01-22 DIAGNOSIS — M19079 Primary osteoarthritis, unspecified ankle and foot: Secondary | ICD-10-CM

## 2016-01-22 NOTE — Progress Notes (Signed)
Patient ID: Cody [name], male   DOB: 05/06/1961, 55 y.o.   MRN: [id number]  Subjective: 55 year old male presents the exiting up orthotics. He states that the injections last limited help his heels. He states that overall his feet to feel better. No recent injury or trauma. Denies any systemic complaints such as fevers, chills, nausea, vomiting. No acute changes since last appointment, and no other complaints at this time.   Objective: AAO x3, NAD DP/PT pulses palpable bilaterally, CRT less than 3 seconds Protective sensation intact with Simms Weinstein monofilament Decreased tenderness palpation of plantar medial tubercle calcaneal insertional plantar fascia. There is no pain with lateral compression of calcaneus. No edema, erythema, increase in warmth. There continues to be flatfoot deformity present bilaterally and range of motion of the subtalar joint is decreased as well as the midfoot. There is some mild discomfort for some MPJ range of motion the left side. No areas of pinpoint bony tenderness or pain with vibratory sensation. MMT 5/5, ROM WNL. No edema, erythema, increase in warmth to bilateral lower extremities.  No open lesions or pre-ulcerative lesions.  No pain with calf compression, swelling, warmth, erythema  Assessment: Resolving heel pain with symptomatic flatfoot, osteoarthritis   Plan: -All treatment options discussed with the patient including all alternatives, risks, complications.  -Orthotics were dispensed today. Oral and written break in instructions were discussed.  -Reviewed the patient x-rays with him today. I discussed long-term treatment with the patient including conservative and surgical treatment. He will pursue surgical treatment however he has not insurance at this time.  -He also states that he let lose weight and he [facility] There is provided today.  -He wishes to hold off on another steroid  injection.  -He's been on weight loss medication [place] however since [place] he has not been on it. I recommended him to discusses primary care physician.  -Follow-up 6 weeks or sooner if any issues are to arise.  -Patient encouraged to call the office with any questions, concerns, change in symptoms.   Cody [name], DPM

## 2016-01-23 ENCOUNTER — Telehealth: Payer: Self-pay | Admitting: *Deleted

## 2016-01-23 NOTE — Telephone Encounter (Signed)
Can you fax the last clinic note

## 2016-01-23 NOTE — Telephone Encounter (Addendum)
Pt states he has new information concerning his PCP. PCP is Dr. Julianne HandlerLachina Hollis [facility] [phone], and fax [phone].  Faxed clinicals of 01/22/2016.

## 2016-02-12 ENCOUNTER — Ambulatory Visit (INDEPENDENT_AMBULATORY_CARE_PROVIDER_SITE_OTHER): Payer: No Typology Code available for payment source | Admitting: Family Medicine

## 2016-02-12 ENCOUNTER — Encounter: Payer: Self-pay | Admitting: Family Medicine

## 2016-02-12 VITALS — BP 134/87 | HR 81 | Temp 97.4°F | Resp 16 | Ht 75.5 in

## 2016-02-12 DIAGNOSIS — B079 Viral wart, unspecified: Secondary | ICD-10-CM

## 2016-02-12 DIAGNOSIS — E8881 Metabolic syndrome: Secondary | ICD-10-CM

## 2016-02-12 DIAGNOSIS — Z8679 Personal history of other diseases of the circulatory system: Secondary | ICD-10-CM

## 2016-02-12 DIAGNOSIS — M25473 Effusion, unspecified ankle: Secondary | ICD-10-CM

## 2016-02-12 DIAGNOSIS — R609 Edema, unspecified: Secondary | ICD-10-CM

## 2016-02-12 DIAGNOSIS — B078 Other viral warts: Secondary | ICD-10-CM

## 2016-02-12 LAB — POCT URINALYSIS DIP (DEVICE)
BILIRUBIN URINE: NEGATIVE
GLUCOSE, UA: NEGATIVE mg/dL
KETONES UR: NEGATIVE mg/dL
LEUKOCYTES UA: NEGATIVE
Nitrite: NEGATIVE
Protein, ur: NEGATIVE mg/dL
SPECIFIC GRAVITY, URINE: 1.015 (ref 1.005–1.030)
Urobilinogen, UA: 0.2 mg/dL (ref 0.0–1.0)
pH: 5.5 (ref 5.0–8.0)

## 2016-02-12 LAB — COMPLETE METABOLIC PANEL WITH GFR
ALT: 14 U/L (ref 9–46)
AST: 11 U/L (ref 10–35)
Albumin: 3.8 g/dL (ref 3.6–5.1)
Alkaline Phosphatase: 63 U/L (ref 40–115)
BUN: 15 mg/dL (ref 7–25)
CHLORIDE: 103 mmol/L (ref 98–110)
CO2: 25 mmol/L (ref 20–31)
Calcium: 9 mg/dL (ref 8.6–10.3)
Creat: 0.78 mg/dL (ref 0.70–1.33)
GFR, Est African American: >89 mL/min (ref >=60)
GFR, Est Non African American: >89 mL/min (ref >=60)
Glucose, Bld: 113 mg/dL — ABNORMAL HIGH (ref 65–99)
Potassium: 3.9 mmol/L (ref 3.5–5.3)
Sodium: 137 mmol/L (ref 135–146)
Total Bilirubin: 0.8 mg/dL (ref 0.2–1.2)
Total Protein: 6.9 g/dL (ref 6.1–8.1)

## 2016-02-12 LAB — LIPID PANEL
Cholesterol: 207 mg/dL — ABNORMAL HIGH (ref 125–200)
HDL: 78 mg/dL (ref >=40)
LDL CALC: 115 mg/dL (ref <130)
TRIGLYCERIDES: 71 mg/dL (ref <150)
Total CHOL/HDL Ratio: 2.7 Ratio (ref <=5.0)
VLDL: 14 mg/dL (ref <30)

## 2016-02-12 LAB — TSH: TSH: 1.28 mIU/L (ref 0.40–4.50)

## 2016-02-12 LAB — GLUCOSE, CAPILLARY: Glucose-Capillary: 109 mg/dL — ABNORMAL HIGH (ref 65–99)

## 2016-02-12 MED ORDER — SALICYLIC ACID 27.5 % EX LIQD: 1.0000 | Freq: Two times a day (BID) | CUTANEOUS | Status: DC

## 2016-02-12 NOTE — Progress Notes (Signed)
Subjective:    Patient ID: Cody [name], male    DOB: 08/13/1961, 55 y.o.   MRN: [id number]  HPI  Cody [name], a 55 year old patient with a history of hypertension and bilateral lower extremity edema presents to establish care. He was previously a patient of Cody [name]. He states that he has been lost to follow-up. Cody [name] is currently complaining of obesity. Patient cites bilateral lower extremity edema and ankle pain as his primary reason for wanting to lose weight. He states that prior to moving to this area, he was on Phentermine for appetite suppression. He says that he has been consistently gaining weight over the past several months. He Unsuccessful weight loss techniques attempted: nutritionist consultation, self-directed dieting and supervised diet program. He has recently started a walking regimen 2-3 days per week. Cody [name] typically eats 1-2 large meals throughout the day.    Cody [name] also complains of bilateral lower extremity edema with ankle pain. He states that symptoms have been present for greater than 6 months.  The edema has been mild and intermittent.   The edema is present all day.   The swelling has been aggravated by dependency of involved area and increased salt intake, relieved by diuretics, and been associated with weight gain. Cardiac risk factors include male gender, obesity (BMI >= 30 kg/m2) and sedentary lifestyle.  Past Medical History  Diagnosis Date  . Hypertension    There is no immunization history on file for this patient.  No Known Allergies  Social History   Social History  . Marital Status: Single    Spouse Name: N/A  . Number of Children: N/A  . Years of Education: N/A   Occupational History  . Not on file.   Social History Main Topics  . Smoking status: Former Games developermoker  . Smokeless tobacco: Never Used  . Alcohol Use: 0.0 oz/week    0 Standard drinks or equivalent per week     Comment: occ  . Drug Use: No  .  Sexual Activity: Not on file   Other Topics Concern  . Not on file   Social History Narrative   Review of Systems  Constitutional: Positive for unexpected weight change (weight gain). Negative for fatigue.  HENT: Negative.   Eyes: Negative.  Negative for photophobia and visual disturbance.  Respiratory: Negative.   Cardiovascular: Negative.   Gastrointestinal: Positive for constipation. Negative for nausea, diarrhea and rectal pain.  Endocrine: Positive for polyphagia and polyuria. Negative for cold intolerance, heat intolerance and polydipsia.  Genitourinary: Negative.   Musculoskeletal: Negative.  Negative for neck pain and neck stiffness.  Skin: Negative.   Allergic/Immunologic: Negative.   Neurological: Negative.  Negative for dizziness.  Hematological: Negative.   Psychiatric/Behavioral: Negative.  Negative for suicidal ideas, sleep disturbance and agitation.       Objective:   Physical Exam  Constitutional: He is oriented to person, place, and time. He appears well-developed and well-nourished.  Morbid obesity  HENT:  Head: Normocephalic and atraumatic.  Right Ear: External ear normal.  Left Ear: External ear normal.  Nose: Nose normal.  Mouth/Throat: Oropharynx is clear and moist.  Eyes: Conjunctivae and EOM are normal. Pupils are equal, round, and reactive to light.  Neck: Normal range of motion. Neck supple.  Pulmonary/Chest: Effort normal and breath sounds normal.  Abdominal: Soft. Bowel sounds are normal.  Increased abdominal girth  Musculoskeletal: Normal range of motion.  Neurological: He is alert and oriented to person, place,  and time. He has normal reflexes.  Skin: Skin is warm, dry and intact.  0.5 cm, painless, Flesh-colored, firm, papule to right index finger  Psychiatric: He has a normal mood and affect. His behavior is normal. Judgment and thought content normal.      BP 134/87 mmHg  Pulse 81  Temp(Src) 97.4 F (36.3 C) (Oral)  Resp 16  Ht 6'  3.5" (1.918 m)  SpO2 97% Assessment & Plan:  1. Ankle edema Recommend that patient elevate lower extremities to heart level while at rest. Patient has been consistently taking furosemide, will check potassium level.  - EKG 12-Lead  2. Morbid obesity due to excess calories (HCC) Recommend a lowfat, low carbohydrate diet divided over 5-6 small meals, increase water intake to 6-8 glasses, and 150 minutes per week of cardiovascular exercise.   - TSH - Hemoglobin A1c - Lipid Panel - COMPLETE METABOLIC PANEL WITH GFR  3. History of hypertension Blood pressure is at goal without anti-hypertensive medications. I will not add medications at this point. The patient is asked to make an attempt to improve diet and exercise patterns to aid in medical management of this problem.  4. Metabolic syndrome [name] has increased abdominal girth, fasting glucose is > 100, and blood pressure greater than 130/80. Patient is also complaining of polyuria and polydipsia. I will screen patient for diabetes mellitus type 2.  - Glucose (CBG)  5. Verruca vulgaris  - Salicylic Acid (VIRASAL) 27.5 % LIQD; Apply 1 each topically 2 (two) times daily.  Dispense: 1 Bottle; Refill: 0   RTC: 1 month for morbid obesity and verruca vulgaris  Santiago Graf M, FNP    The patient was given clear instructions to go to ER or return to medical center if symptoms do not improve, worsen or new problems develop. The patient verbalized understanding. Will notify patient with laboratory results.

## 2016-02-12 NOTE — Patient Instructions (Addendum)
Diet for Metabolic Syndrome Metabolic syndrome is a disorder that includes at least three of these conditions:  Abdominal obesity.  Too much sugar in your blood.  High blood pressure.  Higher than normal amount of fat (lipids) in your blood.  Lower than normal level of "good" cholesterol (HDL). Following a healthy diet can help to keep metabolic syndrome under control. It can also help to prevent the development of conditions that are associated with metabolic syndrome, such as diabetes, heart disease, and stroke. Along with exercise, a healthy diet:  Helps to improve the way that the body uses insulin.  Promotes weight loss. A common goal for people with this condition is to lose at least 7 to 10 percent of their starting weight. WHAT DO I NEED TO KNOW ABOUT THIS DIET?  Use the glycemic index (GI) to plan your meals. The index tells you how quickly a food will raise your blood sugar. Choose foods that have low GI values. These foods take a longer time to raise blood sugar.  Keep track of how many calories you take in. Eating the right amount of calories will help your achieve a healthy weight.  You may want to follow a Mediterranean diet. This diet includes lots of vegetables, lean meats or fish, whole grains, fruits, and healthy oils and fats. WHAT FOODS CAN I EAT? Grains Stone-ground whole wheat. Pumpernickel bread. Whole-grain bread, crackers, tortillas, cereal, and pasta. Unsweetened oatmeal.Bulgur.Barley.Quinoa.Brown rice or wild rice. Vegetables Lettuce. Spinach. Peas. Beets. Cauliflower. Cabbage. Broccoli. Carrots. Tomatoes. Squash. Eggplant. Herbs. Peppers. Onions. Cucumbers. Brussels sprouts. Sweet potatoes. Yams. Beans. Lentils. Fruits Berries. Apples. Oranges. Grapes. Mango. Pomegranate. Kiwi. Cherries. Meats and Other Protein Sources Seafood and shellfish. Lean meats.Poultry. Tofu. Dairy Low-fat or fat-free dairy products, such as milk, yogurt, and  cheese. Beverages Water. Low-fat milk. Milk alternatives, like soy milk or almond milk. Real fruit juice. Condiments Low-sugar or sugar-free ketchup, barbecue sauce, and mayonnaise. Mustard. Relish. Fats and Oils Avocado. Canola or olive oil. Nuts and nut butters.Seeds. The items listed above may not be a complete list of recommended foods or beverages. Contact your dietitian for more options.  WHAT FOODS ARE NOT RECOMMENDED? Red meat. Palm oil and coconut oil. Processed foods. Fried foods. Alcohol. Sweetened drinks, such as iced tea and soda. Sweets. Salty foods. The items listed above may not be a complete list of foods and beverages to avoid. Contact your dietitian for more information.   This information is not intended to replace advice given to you by your health care provider. Make sure you discuss any questions you have with your health care provider.   Document Released: 03/07/2015 Document Reviewed: 03/07/2015 Elsevier Interactive Patient Education 2016 [name]. Metabolic Syndrome Metabolic syndrome is the presence of at least three factors that increase your risk of getting cardiovascular disease and diabetes. These factors are:  High blood sugar.  High blood triglyceride level.  High blood pressure.  Low levels of good blood cholesterol (high-density lipoprotein or HDL).  Excess weight around the waist. This factor is present with a waist measurement of:  More than 40 inches in men.  More than 35 inches in women. Metabolic syndrome is sometimes called insulin resistance syndrome and syndrome X. CAUSES The exact cause is not known, but genetics and lifestyle choices play a role. RISK FACTORS You are more likely to develop metabolic syndrome if:  You eat a diet high in calories and saturated fat.  You do not exercise regularly.  You are overweight.  You have a family history of metabolic syndrome.  You are Asian.  You are older in age.  You have  insulin resistance.  You use any tobacco products, including cigarettes, chewing tobacco, or electronic cigarettes. SIGNS AND SYMPTOMS Metabolic syndrome has no specific symptoms. DIAGNOSIS To make a diagnosis, your health care provider will determine whether you have at least three of the factors that make up metabolic syndrome by:  Taking your blood pressure.  Measuring your waist.  Ordering blood tests. TREATMENT Treatment may include:  Lifestyle changes to reduce your risk for heart disease and stroke, such as:  Exercise.  Weight loss.  Maintaining a healthy diet.  Quitting the use of any tobacco products, including cigarettes, chewing tobacco, or electronic cigarettes.  Medicines that:  Help your body to maintain glucose control.  Reduce your blood pressure and your blood triglyceride levels. HOME CARE INSTRUCTIONS  Exercise regularly.  Maintain a healthy diet.  Do not use any tobacco products, including cigarettes, chewing tobacco, or electronic cigarettes. If you need help quitting, ask your health care provider.  Keep all follow-up visits as directed by your health care provider. This is important.  Measure your waist regularly and record the measurement. To measure your waist:  Stand up straight.  Breathe out.  Wrap the measuring tape around the part of your waist that is just above your hipbones.  Read the measurement. SEEK MEDICAL CARE IF:  You feel very tired.  You develop excessive thirst.  You pass large quantities of urine.  You put on weight around your waist.  You have headaches over and over again.  You have a dizzy spell. SEEK IMMEDIATE MEDICAL CARE IF:  You develop sudden blurred vision.  You develop a sudden dizzy spell.  You have sudden trouble speaking or swallowing.  You have sudden weakness in your arm or leg.  You have chest pains or trouble breathing.  You feel like your heartbeat is abnormal.  You faint.   This  information is not intended to replace advice given to you by your health care provider. Make sure you discuss any questions you have with your health care provider.   Document Released: 01/28/2008 Document Revised: 11/11/2014 Document Reviewed: 05/27/2014 Elsevier Interactive Patient Education 2016 Elsevier Inc. Salicylic Acid topical gel, cream, lotion, solution What is this medicine? SALICYCLIC ACID (SAL i SIL ik AS id) breaks down layers of thick skin. It is used to treat common and plantar warts, psoriasis, calluses, and corns. It is also used to treat or to prevent acne. This medicine may be used for other purposes; ask your health care provider or pharmacist if you have questions. What should I tell my health care provider before I take this medicine? They need to know if you have any of these conditions: -child with chickenpox, the flu, or other viral infection -kidney disease -liver disease -an unusual or allergic reaction to salicylic acid, other medicines, foods, dyes, or preservatives -pregnant or trying to get pregnant -breast-feeding How should I use this medicine? This medicine is for external use only. Follow the directions on the label. Do not apply to raw or irritated skin. Avoid getting medicine in your eyes, lips, nose, mouth, or other sensitive areas. Use this medicine at regular intervals. Do not use more often than directed. Talk to your pediatrician regarding the use of this medicine in children. Special care may be needed. This medicine is not approved for use in children under 54 years old. Overdosage: If you think  you have taken too much of this medicine contact a poison control center or emergency room at once. NOTE: This medicine is only for you. Do not share this medicine with others. What if I miss a dose? If you miss a dose, use it as soon as you can. If it is almost time for your next dose, use only that dose. Do not use double or extra doses. What may interact  with this medicine? -medicines that change urine pH like ammonium chloride, sodium bicarbonate, and others -medicines that treat or prevent blood clots like warfarin -methotrexate -pyrazinamide -some medicines for diabetes -some medicines for gout -steroid medicines like prednisone or cortisone This list may not describe all possible interactions. Give your health care provider a list of all the medicines, herbs, non-prescription drugs, or dietary supplements you use. Also tell them if you smoke, drink alcohol, or use illegal drugs. Some items may interact with your medicine. What should I watch for while using this medicine? Tell your doctor is your symptoms do not get better or if they get worse. This medicine can make you more sensitive to the sun. Keep out of the sun. If you cannot avoid being in the sun, wear protective clothing and use sunscreen. Do not use sun lamps or tanning beds/booths. Use of this medicine in children under 12 years or in patients with kidney or liver disease may increase the risk of serious side effects. These patients should not use this medicine over large areas of skin. If you notice symptoms such as nausea, vomiting, dizziness, loss of hearing, ringing in the ears, unusual weakness or tiredness, fast or labored breathing, diarrhea, or confusion, stop using this medicine and contact your doctor or health care professional. What side effects may I notice from receiving this medicine? Side effects that you should report to your doctor or health care professional as soon as possible: -allergic reactions like skin rash, itching or hives, swelling of the face, lips, or tongue Side effects that usually do not require medical attention (report to your doctor or health care professional if they continue or are bothersome): -skin irritation This list may not describe all possible side effects. Call your doctor for medical advice about side effects. You may report side effects  to FDA at 1-800-FDA-1088. Where should I keep my medicine? Keep out of the reach of children. Store at room temperature between 15 and 30 degrees C (59 and 86 degrees F). Do not freeze. Throw away any unused medicine after the expiration date. NOTE: This sheet is a summary. It may not cover all possible information. If you have questions about this medicine, talk to your doctor, pharmacist, or health care provider.    2016, Elsevier/Gold Standard. (2008-06-24 13:36:20) Warts Warts are small growths on the skin. They are common, and they are caused by a type of germ (virus). Warts can occur on many areas of the body. A person may have one wart or more than one wart. Warts can spread if you scratch a wart and then scratch normal skin. Most warts will go away over many months to a couple years. Treatments may be done if needed. HOME CARE  Apply over-the-counter and prescription medicines only as told by your doctor.  Do not apply over-the-counter wart medicines to your face or genitals before you ask your doctor if it is okay to do that.  Do not scratch or pick at a wart.  Wash your hands after you touch a wart.  Avoid shaving  hair that is over a wart.  Keep all follow-up visits as told by your doctor. This is important. GET HELP IF:  Your warts do not improve after treatment.  You have redness, swelling, or pain at the site of a wart.  You have bleeding from a wart, and the bleeding does not stop when you put light pressure on the wart.  You have diabetes and you get a wart.   This information is not intended to replace advice given to you by your health care provider. Make sure you discuss any questions you have with your health care provider.   Document Released: 02/21/2011 Document Revised: 07/12/2015 Document Reviewed: 01/16/2015 Elsevier Interactive Patient Education [employer]

## 2016-02-13 ENCOUNTER — Telehealth: Payer: Self-pay

## 2016-02-13 LAB — HEMOGLOBIN A1C
Hgb A1c MFr Bld: 5.9 % — ABNORMAL HIGH (ref <5.7)
Mean Plasma Glucose: 123 mg/dL

## 2016-02-13 NOTE — Telephone Encounter (Signed)
Called, no answer. Left message asking patient to return call when able and left call back info. Thanks!

## 2016-02-13 NOTE — Telephone Encounter (Signed)
-----   Message from Massie MaroonLachina M Hollis, [place]NP sent at 02/13/2016  7:34 AM EDT ----- Regarding: lab results Please inform patient that hemoglobin a1C is 5.9 %, which is consistent with prediabetes. Also, total cholesterol is 209, goal is < 200. He does not warrant medications at this point. Recommend a lowfat, low carbohydrate diet divided over 5-6 small meals, increase water intake to 6-8 glasses, and 150 minutes per week of cardiovascular exercise. Also, potassium is normal, he can continue Lasix for lower extremity edema.  Please follow up in office as scheduled.  Thanks  ----- Message -----    [facility] [place]    Sent: 02/12/2016  10:14 AM      To: Massie MaroonLachina M Hollis, FNP

## 2016-02-14 NOTE — Telephone Encounter (Signed)
Called and spoke with patient, advised of lab results and to start low fat/low carb diet over 5 to 6 small meals daily, asked patient to drink 6 to 8 glasses of water daily and to exercise around 150 minutes weekly. Advised patient of normal potassium and to continue lasix as directed. Patient verbalized understanding and has no question at this time. Thanks!

## 2016-02-19 NOTE — Telephone Encounter (Signed)
Entered in error

## 2016-03-04 ENCOUNTER — Ambulatory Visit: Payer: No Typology Code available for payment source | Admitting: Podiatry

## 2016-03-22 ENCOUNTER — Ambulatory Visit: Payer: No Typology Code available for payment source | Admitting: Family Medicine

## 2016-03-25 ENCOUNTER — Ambulatory Visit: Payer: No Typology Code available for payment source | Admitting: Podiatry

## 2016-03-25 ENCOUNTER — Encounter: Payer: Self-pay | Admitting: Podiatry

## 2016-03-25 VITALS — BP 169/81 | HR 104 | Resp 18

## 2016-03-25 DIAGNOSIS — M19079 Primary osteoarthritis, unspecified ankle and foot: Secondary | ICD-10-CM

## 2016-03-25 DIAGNOSIS — M722 Plantar fascial fibromatosis: Secondary | ICD-10-CM

## 2016-03-25 MED ORDER — MELOXICAM 15 MG PO TABS: 15.0000 mg | ORAL_TABLET | Freq: Every day | ORAL | Status: DC

## 2016-03-25 MED FILL — MELOXICAM 15 MG TABLET: 15 | 30 days supply | Qty: 30 | Fill #0

## 2016-03-25 NOTE — Patient Instructions (Signed)

## 2016-03-26 NOTE — Progress Notes (Signed)
Patient ID: Cody [name], male   DOB: 11/06/1960, 55 y.o.   MRN: [id number]  Subjective: 55 year old male presents to the office for bilateral foot pain, heel pain. He states that since given the orthotics again directly tell a difference in the speed and he is doing better. He does take meloxicam intermittently which also helps and asking for refill today. He has been stretching icing as well. He does so he has made significant improvement since starting treatment. No recent injury. 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 Denies any systemic complaints such as fevers, chills, nausea, vomiting. No acute changes since last appointment, and no other complaints at this time.   Objective: AAO x3, NAD DP/PT pulses palpable bilaterally, CRT less than 3 seconds Protective sensation intact with Dorann OuSimms Weinstein monofilament There is no significant tenderness to palpation of plantar medial tubercle calcaneal insertional plantar fascia. There is no pain with lateral compression of calcaneus. No edema, erythema, increase in warmth. There continues to be flatfoot deformity present bilaterally and range of motion of the subtalar joint is decreased as well as the midfoot. There is no discomfort along the  MPJ range of motion the left side. No areas of pinpoint bony tenderness or pain with vibratory sensation. MMT 5/5, ROM WNL. No edema, erythema, increase in warmth to bilateral lower extremities.  No open lesions or pre-ulcerative lesions.  No pain with calf compression, swelling, warmth, erythema  Assessment: Resolving heel pain with symptomatic flatfoot, osteoarthritis   Plan: -All treatment options discussed with the patient including all alternatives, risks, complications.  -Continue with orthotics and supportive shoe gear. Refilled meloxicam discussed side effects. Also continue stretching, icing. -Follow-up syndrome is not resolved next 4-6 weeks or sooner if any  issues are to arise. In the meantime call any questions or concerns.   Ovid CurdMatthew Wagoner, DPM

## 2016-03-28 ENCOUNTER — Other Ambulatory Visit: Payer: Self-pay | Admitting: Family Medicine

## 2016-03-28 ENCOUNTER — Encounter: Payer: No Typology Code available for payment source | Admitting: Family Medicine

## 2016-03-28 DIAGNOSIS — K029 Dental caries, unspecified: Secondary | ICD-10-CM

## 2016-04-01 NOTE — Progress Notes (Signed)
This encounter was created in error - please disregard.

## 2016-04-19 ENCOUNTER — Ambulatory Visit: Payer: Self-pay | Admitting: Internal Medicine

## 2016-04-26 ENCOUNTER — Ambulatory Visit: Payer: No Typology Code available for payment source | Admitting: Podiatry

## 2016-05-21 MED FILL — MELOXICAM 15 MG TABLET: 15 | 30 days supply | Qty: 30 | Fill #1

## 2016-06-04 ENCOUNTER — Telehealth: Payer: Self-pay | Admitting: *Deleted

## 2016-06-04 NOTE — Telephone Encounter (Signed)
Stop all antiinflammatories. If continues follow up with PCP

## 2016-06-04 NOTE — Telephone Encounter (Addendum)
Pt states the meloxicam is causing severe stomach pain. I instructed pt to stop the Meloxicam, begin ice therapy for pain and inflammation and I would call again with instructions from Dr. Ardelle Anton. 06/05/2016-DrArdelle Anton ordered stop NSAIDS, and go to PCP if problem continues.  I informed pt of Dr. Gabriel Rung orders and recommended pt use ice 3-4 times a day at least and for 15-20 minutes each time and Tylenol OTC if tolerates for other pain coverage, and call with concerns. Pt called again states he needs some relief and he is not able to ice his feet 3-4 times a day, and if we could prescribe something he had before that helped. Dr. Ardelle Anton ordered Tylenol#3.  I left message for pt to inform he could pick up the Tylenol#3 rx in the [place] office to hand carry to pharmacy, to ice when not on his feet or working, not to drive or work and use the [employer]. Left message informing pt Dr. Ardelle Anton had prescribed a compound cream he could use during the day, it would be coming from [facility] in [place] [phone]. Faxed to [employer]. 06/18/2016-Pt states his Tyl[facility]ol #3 was not [facility]. I left a message informing pt his Tylenol#3 rx could not be called in but was waiting at the [facility] to be picked up. 06/20/2016-Pt called stating he was upset because his Tylenol#3 was not at the pharmacy.  I told pt I could mail to him but it could not be called in. Pt agreed to have the rx mailed to his home.

## 2016-06-05 MED ORDER — NONFORMULARY OR COMPOUNDED ITEM: 3 refills | Status: DC

## 2016-06-05 MED ORDER — ACETAMINOPHEN-CODEINE #3 300-30 MG PO TABS: 1.0000 | ORAL_TABLET | ORAL | 0 refills | Status: DC | PRN

## 2016-06-05 NOTE — Telephone Encounter (Signed)
-----   Message from Vivi Barrack, DPM sent at 06/05/2016  3:22 PM EDT ----- OK to do anti-inflammatory compound cream ----- Message ----- From: Marissa Nestle, RN Sent: 06/05/2016   1:37 PM To: Vivi Barrack, DPM  Dr. Ardelle Anton, how about [employer] antiinflammatory or pain cream, pt works 10-12 hour days?  Joya San

## 2016-06-05 NOTE — Telephone Encounter (Signed)
He can do some tylenon #3 if needed 1 tab PO q4 hr prn dis #20

## 2016-07-25 ENCOUNTER — Telehealth: Payer: Self-pay

## 2016-07-25 MED ORDER — FUROSEMIDE 20 MG PO TABS: 20.0000 mg | ORAL_TABLET | Freq: Every day | ORAL | 3 refills | Status: DC

## 2016-07-25 NOTE — Telephone Encounter (Signed)
Refill sent into pharmacy. Thanks!  

## 2016-07-26 ENCOUNTER — Other Ambulatory Visit: Payer: Self-pay | Admitting: Internal Medicine

## 2016-07-29 ENCOUNTER — Other Ambulatory Visit: Payer: Self-pay

## 2016-07-29 MED ORDER — FUROSEMIDE 20 MG PO TABS
20.0000 mg | ORAL_TABLET | Freq: Every day | ORAL | 3 refills | Status: DC
Start: 2016-07-29 — End: 2016-08-13

## 2016-07-29 NOTE — Telephone Encounter (Signed)
This has been sent to corrected pharmacy.

## 2016-08-05 ENCOUNTER — Telehealth: Payer: Self-pay

## 2016-08-05 NOTE — Telephone Encounter (Signed)
This was refilled on 07/29/2016. Called and left message on voicemail advising patient that this was sent into [facility] and if he needed anything else to call back. Thanks!

## 2016-08-08 ENCOUNTER — Telehealth: Payer: Self-pay | Admitting: *Deleted

## 2016-08-08 NOTE — Telephone Encounter (Addendum)
Pt request refill of Tylenol #3. Pt's LOV 03/2016. I left message informing pt his last appt was 03/2016, and that pain medication only masked the problem, but did not correct it, that there were therapy options for treating achilles tendonitis to call for an appt. 12/04/2016-pt called for refill of the Tylenol#3. I informed pt that we could not refill the Tylenol#3 without reevaluating him, pt states understanding and asked if I could transfer to schedulers.

## 2016-08-09 ENCOUNTER — Telehealth: Payer: Self-pay

## 2016-08-09 NOTE — Telephone Encounter (Signed)
Called, no answer. Left message for patient to call  Back regarding concern. Thanks!

## 2016-08-12 NOTE — Telephone Encounter (Signed)
Spoke with patient, complained of rash and though it was contributed to lasix. Patient denies shortness of breath, chest pain, or difficulty breathing. Patient has been on lasix for 1 year and has only had the problem times 2 weeks. Explains that nothing new has been started. Asked patient to schedule an appointment to come in for evaluation. Thanks!

## 2016-08-13 ENCOUNTER — Encounter: Payer: Self-pay | Admitting: Family Medicine

## 2016-08-13 ENCOUNTER — Ambulatory Visit (INDEPENDENT_AMBULATORY_CARE_PROVIDER_SITE_OTHER): Payer: Self-pay | Admitting: Family Medicine

## 2016-08-13 VITALS — BP 138/82 | HR 83 | Temp 98.2°F | Resp 18 | Ht 75.5 in

## 2016-08-13 DIAGNOSIS — R062 Wheezing: Secondary | ICD-10-CM

## 2016-08-13 DIAGNOSIS — Z125 Encounter for screening for malignant neoplasm of prostate: Secondary | ICD-10-CM

## 2016-08-13 DIAGNOSIS — I10 Essential (primary) hypertension: Secondary | ICD-10-CM

## 2016-08-13 DIAGNOSIS — R600 Localized edema: Secondary | ICD-10-CM

## 2016-08-13 DIAGNOSIS — Z23 Encounter for immunization: Secondary | ICD-10-CM

## 2016-08-13 LAB — COMPLETE METABOLIC PANEL WITH GFR
ALBUMIN: 3.7 g/dL (ref 3.6–5.1)
ALK PHOS: 58 U/L (ref 40–115)
ALT: 17 U/L (ref 9–46)
AST: 16 U/L (ref 10–35)
BUN: 15 mg/dL (ref 7–25)
CO2: 24 mmol/L (ref 20–31)
CREATININE: 0.84 mg/dL (ref 0.70–1.33)
Calcium: 9 mg/dL (ref 8.6–10.3)
Chloride: 105 mmol/L (ref 98–110)
GFR, Est African American: >89 mL/min (ref >=60)
Glucose, Bld: 91 mg/dL (ref 65–99)
Potassium: 3.8 mmol/L (ref 3.5–5.3)
Sodium: 139 mmol/L (ref 135–146)
Total Bilirubin: 0.9 mg/dL (ref 0.2–1.2)
Total Protein: 6.7 g/dL (ref 6.1–8.1)

## 2016-08-13 LAB — LIPID PANEL
Cholesterol: 187 mg/dL (ref 125–200)
HDL: 60 mg/dL (ref >=40)
LDL Cholesterol: 109 mg/dL (ref <130)
TRIGLYCERIDES: 89 mg/dL (ref <150)
Total CHOL/HDL Ratio: 3.1 Ratio (ref <=5.0)
VLDL: 18 mg/dL (ref <30)

## 2016-08-13 LAB — CBC WITH DIFFERENTIAL/PLATELET
BASOS ABS: 0 {cells}/uL (ref 0–200)
Basophils Relative: 0 %
EOS ABS: 146 {cells}/uL (ref 15–500)
Eosinophils Relative: 2 %
HEMATOCRIT: 42 % (ref 38.5–50.0)
Hemoglobin: 13.3 g/dL (ref 13.2–17.1)
Lymphocytes Relative: 46 %
Lymphs Abs: 3358 cells/uL (ref 850–3900)
MCH: 28.8 pg (ref 27.0–33.0)
MCHC: 31.7 g/dL — ABNORMAL LOW (ref 32.0–36.0)
MCV: 90.9 fL (ref 80.0–100.0)
MONO ABS: 584 {cells}/uL (ref 200–950)
MPV: 9.3 fL (ref 7.5–12.5)
Monocytes Relative: 8 %
NEUTROS ABS: 3212 {cells}/uL (ref 1500–7800)
Neutrophils Relative %: 44 %
Platelets: 250 10*3/uL (ref 140–400)
RBC: 4.62 MIL/uL (ref 4.20–5.80)
RDW: 15.3 % — ABNORMAL HIGH (ref 11.0–15.0)
WBC: 7.3 10*3/uL (ref 3.8–10.8)

## 2016-08-13 MED ORDER — ALBUTEROL SULFATE HFA 108 (90 BASE) MCG/ACT IN AERS: 2.0000 | INHALATION_SPRAY | RESPIRATORY_TRACT | 0 refills | Status: DC | PRN

## 2016-08-13 MED ORDER — SPIRONOLACTONE 25 MG PO TABS: 25.0000 mg | ORAL_TABLET | Freq: Every day | ORAL | 1 refills | Status: DC

## 2016-08-13 NOTE — Patient Instructions (Signed)
Call in one month and let me know if the aldactone is working to keep the swelling of feet and legs down.

## 2016-08-13 NOTE — Progress Notes (Signed)
Cody [name], is a 55 y.o. male  [id number]  [id number]  DOB - 1961/02/04  CC:  Chief Complaint  Patient presents with  . Wheezing    patient states wheezing x 4 months   . Rash    rash on sides, arms, and back   . Fatigue       HPI: Cody [name] is a 55 y.o. male here to establish care  No Known Allergies Past Medical History:  Diagnosis Date  . Hypertension    Current Outpatient Prescriptions on File Prior to Visit  Medication Sig Dispense Refill  . acetaminophen-codeine (TYLENOL #3) 300-30 MG tablet Take 1 tablet by mouth every 4 (four) hours as needed for moderate pain. (Patient not taking: Reported on 08/13/2016) 30 tablet 0  . meloxicam (MOBIC) 15 MG tablet Take 1 tablet (15 mg total) by mouth daily. (Patient not taking: Reported on 08/13/2016) 30 tablet 2  . NONFORMULARY OR COMPOUNDED ITEM [facility]:  Antiiflammatory cream - Diclofenac 3%, Baclofen 2%, Cyclobenzaprine 2%, and Lidocaine 2%, apply 1-2 grams to affected area 3-4 times daily. (Patient not taking: Reported on 08/13/2016) 120 each 3  . Salicylic Acid (VIRASAL) 27.5 % LIQD Apply 1 each topically 2 (two) times daily. (Patient not taking: Reported on 08/13/2016) 1 Bottle 0  . triamcinolone cream (KENALOG) 0.1 % Use topically twice daily to affected areas as needed (Patient not taking: Reported on 08/13/2016) 454 g 3   No current facility-administered medications on file prior to visit.    History reviewed. No pertinent family history. Social History   Social History  . Marital status: Single    Spouse name: N/A  . Number of children: N/A  . Years of education: N/A   Occupational History  . Not on file.   Social History Main Topics  . Smoking status: Former Games developer  . Smokeless tobacco: Never Used  . Alcohol use 0.0 oz/week     Comment: occ  . Drug use: No  . Sexual activity: Not on file   Other Topics Concern  . Not on file   Social History Narrative  . No narrative on  file    Review of Systems: Constitutional: Negative Skin: Negative HENT: Negative  Eyes: Negative  Neck: Negative Respiratory: Negative Cardiovascular: Negative Gastrointestinal: Negative Genitourinary: Negative  Musculoskeletal: Negative   Neurological: Negative for Hematological: Negative  Psychiatric/Behavioral: Negative    Objective:   Vitals:   08/13/16 1444  BP: 138/82  Pulse: 83  Resp: 18  Temp: 98.2 F (36.8 C)    Physical Exam: Constitutional: Patient appears well-developed and well-nourished. No distress. HENT: Normocephalic, atraumatic, External right and left ear normal. Oropharynx is clear and moist.  Eyes: Conjunctivae and EOM are normal. PERRLA, no scleral icterus. Neck: Normal ROM. Neck supple. No lymphadenopathy, No thyromegaly. CVS: RRR, S1/S2 +, no murmurs, no gallops, no rubs Pulmonary: Effort and breath sounds normal, no stridor, rhonchi, wheezes, rales.  Abdominal: Soft. Normoactive BS,, no distension, tenderness, rebound or guarding.  Musculoskeletal: Normal range of motion. No edema and no tenderness.  Neuro: Alert.Normal muscle tone coordination. Non-focal Skin: Skin is warm and dry. No rash noted. Not diaphoretic. No erythema. No pallor. Psychiatric: Normal mood and affect. Behavior, judgment, thought content normal.  Lab Results  Component Value Date   HGB 15.6 06/16/2014   HCT 46.0 06/16/2014   Lab Results  Component Value Date   CREATININE 0.78 02/12/2016   BUN 15 02/12/2016   NA 137 02/12/2016   K 3.9 02/12/2016  CL 103 02/12/2016   CO2 25 02/12/2016    Lab Results  Component Value Date   HGBA1C 5.9 (H) 02/12/2016   Lipid Panel     Component Value Date/Time   CHOL 207 (H) 02/12/2016 1003   TRIG 71 02/12/2016 1003   HDL 78 02/12/2016 1003   CHOLHDL 2.7 02/12/2016 1003   VLDL 14 02/12/2016 1003   LDLCALC 115 02/12/2016 1003       Assessment and plan:   1. Essential hypertension  - CBC with Differential -  COMPLETE METABOLIC PANEL WITH GFR - Lipid panel  2. Screening for malignant neoplasm of prostate  - PSA  3. Wheezing  - albuterol (PROVENTIL HFA;VENTOLIN HFA) 108 (90 Base) MCG/ACT inhaler; Inhale 2 puffs into the lungs every 4 (four) hours as needed for wheezing or shortness of breath.  Dispense: 1 Inhaler; Refill: 0  4. Pedal edema  - spironolactone (ALDACTONE) 25 MG tablet; Take 1 tablet (25 mg total) by mouth daily.  Dispense: 90 tablet; Refill: 1  5. Need for Tdap vaccination  - Tdap vaccine greater than or equal to 7yo IM   No Follow-up on file.  The patient was given clear instructions to go to ER or return to medical center if symptoms don't improve, worsen or new problems develop. The patient verbalized understanding.    Henrietta HooverLinda C Bernhardt FNP  08/13/2016, 3:24 PM

## 2016-08-14 LAB — PSA: PSA: 0.2 ng/mL (ref <=4.0)

## 2016-09-07 ENCOUNTER — Encounter (HOSPITAL_COMMUNITY): Payer: Self-pay | Admitting: Emergency Medicine

## 2016-09-07 ENCOUNTER — Ambulatory Visit (HOSPITAL_COMMUNITY)
Admission: EM | Admit: 2016-09-07 | Discharge: 2016-09-07 | Disposition: A | Discharge status: Home / Self Care | Payer: No Typology Code available for payment source | Attending: Emergency Medicine | Admitting: Emergency Medicine

## 2016-09-07 DIAGNOSIS — J069 Acute upper respiratory infection, unspecified: Secondary | ICD-10-CM

## 2016-09-07 MED ORDER — ALBUTEROL SULFATE HFA 108 (90 BASE) MCG/ACT IN AERS: 2.0000 | INHALATION_SPRAY | RESPIRATORY_TRACT | 0 refills | Status: DC | PRN

## 2016-09-07 NOTE — ED Triage Notes (Signed)
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 NAD

## 2016-09-07 NOTE — Discharge Instructions (Signed)
Sudafed 10 mg every 4 hours as needed for congestion Allegra or Zyrtec daily as needed for drainage and runny nose. For stronger antihistamine may take Chlor-Trimeton 2 to 4 mg every 4 to 6 hours, may cause drowsiness. Saline nasal spray used frequently. Ibuprofen 600 mg every 6 hours as needed for pain, discomfort or fever. Drink plenty of fluids and stay well-hydrated. Flonase or Rhinocort nasal spray daily

## 2016-09-07 NOTE — ED Provider Notes (Signed)
CSN: [id number]     Arrival date & time 09/07/16  1411 History   First MD Initiated Contact with Patient 09/07/16 1613     Chief Complaint  Patient presents with  . 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 This morning he developed a cough, chest congestion, body aches and loose stools. He felt hot and cold and took his temperature stating that it was a feverish 98.5.      Past Medical History:  Diagnosis Date  . Hypertension    Past Surgical History:  Procedure Laterality Date  . ANKLE FUSION     left ankle 1980'S  . FOOT BONE EXCISION     History reviewed. No pertinent family history. Social History  Substance Use Topics  . Smoking status: Former Games developermoker  . Smokeless tobacco: Never Used  . Alcohol use 0.0 oz/week     Comment: occ    Review of Systems  Constitutional: Positive for activity change and fever. Negative for diaphoresis and fatigue.  HENT: Positive for congestion and postnasal drip. Negative for ear pain, facial swelling, rhinorrhea, sore throat and trouble swallowing.   Eyes: Negative for pain, discharge and redness.  Respiratory: Positive for cough. Negative for chest tightness and shortness of breath.   Cardiovascular: Negative.   Gastrointestinal: Negative.   Musculoskeletal: Negative.  Negative for neck pain and neck stiffness.  Neurological: Negative.   All other systems reviewed and are negative.   Allergies  Review of patient's allergies indicates no known allergies.  Home Medications   Prior to Admission medications   Medication Sig Start Date End Date Taking? Authorizing Provider  albuterol (PROVENTIL HFA;VENTOLIN HFA) 108 (90 Base) MCG/ACT inhaler Inhale 2 puffs into the lungs every 4 (four) hours as  needed for wheezing or shortness of breath. 09/07/16   Hayden Rasmussenavid Kameran Lallier, NP  spironolactone (ALDACTONE) 25 MG tablet Take 1 tablet (25 mg total) by mouth daily. 08/13/16   Henrietta HooverLinda C Bernhardt, NP   Meds Ordered and Administered this Visit  Medications - No data to display  BP 168/96 (BP Location: Left Wrist)   Pulse 98   Temp 98.4 F (36.9 C) (Oral)   Resp 18   SpO2 95%  No data found.   Physical Exam  Constitutional: He is oriented to person, place, and time. He appears well-developed and well-nourished. No distress.  HENT:  Head: Normocephalic and atraumatic.  Right Ear: External ear normal.  Left Ear: External ear normal.  Mouth/Throat: No oropharyngeal exudate.  Bilateral TMs are normal. Oropharynx is clear. Moderate amount of clear PND. No exudates or swelling.   Eyes: EOM are normal.  Neck: Normal range of motion. Neck supple.  Cardiovascular: Normal rate, regular rhythm, normal heart sounds and intact distal pulses.   Pulmonary/Chest: Effort normal and breath sounds normal. No respiratory distress.  Good air movement. With forced expiration there is faint end-expiratory wheeze. No other adventitious sounds.  Musculoskeletal: Normal range of motion. He exhibits no edema.  Lymphadenopathy:    He has no cervical adenopathy.  Neurological: He is alert and oriented to person, place, and time.  Skin: Skin is warm and dry. Capillary refill takes less than 2 seconds. He is not diaphoretic.  Psychiatric: He has a normal mood and affect.  Nursing note and vitals reviewed.   Urgent Care Course   Clinical Course  Procedures (including critical care time)  Labs Review Labs Reviewed - No data to display  Imaging Review No results found.   Visual Acuity Review  Right Eye Distance:   Left Eye Distance:   Bilateral Distance:    Right Eye Near:   Left Eye Near:    Bilateral Near:         MDM   1. Acute upper respiratory infection    Sudafed 10 mg every 4 hours as  needed for congestion Allegra or Zyrtec daily as needed for drainage and runny nose. For stronger antihistamine may take Chlor-Trimeton 2 to 4 mg every 4 to 6 hours, may cause drowsiness. Saline nasal spray used frequently. Ibuprofen 600 mg every 6 hours as needed for pain, discomfort or fever. Drink plenty of fluids and stay well-hydrated. Flonase or Rhinocort nasal spray daily     Hayden Rasmussenavid Ericca Labra, NP 09/07/16 1642

## 2016-09-10 ENCOUNTER — Encounter (HOSPITAL_COMMUNITY): Payer: Self-pay | Admitting: Emergency Medicine

## 2016-09-10 ENCOUNTER — Ambulatory Visit (HOSPITAL_COMMUNITY)
Admission: EM | Admit: 2016-09-10 | Discharge: 2016-09-10 | Disposition: A | Discharge status: Home / Self Care | Payer: No Typology Code available for payment source | Attending: Family Medicine | Admitting: Family Medicine

## 2016-09-10 DIAGNOSIS — B9789 Other viral agents as the cause of diseases classified elsewhere: Secondary | ICD-10-CM

## 2016-09-10 DIAGNOSIS — J069 Acute upper respiratory infection, unspecified: Secondary | ICD-10-CM

## 2016-09-10 NOTE — ED Provider Notes (Signed)
CSN: [id number]     Arrival date & time 09/10/16  1415 History   First MD Initiated Contact with Patient 09/10/16 1432     Chief Complaint  Patient presents with  . Diarrhea   (Consider location/radiation/quality/duration/timing/severity/associated sxs/prior Treatment) 55 year old morbidly obese male was seen in the urgent care 3 days ago for typical uncomplicated upper respiratory symptoms. He was treated with medications OTC and given a note to be out of work for 3 days at his request. He states he has been noncompliant with some of the medications and fluid consumption. He presents with similar complaints with body aches and congestion and occasional loose stools. He is taking Pepto-Bismol. He states that he was told by his supervisors that he cannot come back to work as long as he is sick. The patient is primarily here for another work note.  Patient also notes his fever at home was running 99.4. Currently he is afebrile.      Past Medical History:  Diagnosis Date  . Hypertension    Past Surgical History:  Procedure Laterality Date  . ANKLE FUSION     left ankle 1980'S  . FOOT BONE EXCISION     History reviewed. No pertinent family history. Social History  Substance Use Topics  . Smoking status: Former Games developermoker  . Smokeless tobacco: Never Used  . Alcohol use 0.0 oz/week     Comment: occ    Review of Systems  Constitutional: Positive for activity change and fever. Negative for diaphoresis and fatigue.  HENT: Positive for congestion, postnasal drip and sore throat. Negative for ear pain, facial swelling, rhinorrhea and trouble swallowing.   Eyes: Negative for pain, discharge and redness.  Respiratory: Positive for cough. Negative for chest tightness and shortness of breath.   Cardiovascular: Negative.   Gastrointestinal: Negative.  Negative for abdominal pain.       Occasional loose stools.  Musculoskeletal: Negative.  Negative for neck pain and neck stiffness.   Neurological: Negative.   All other systems reviewed and are negative.   Allergies  Patient has no known allergies.  Home Medications   Prior to Admission medications   Medication Sig Start Date End Date Taking? Authorizing Provider  albuterol (PROVENTIL HFA;VENTOLIN HFA) 108 (90 Base) MCG/ACT inhaler Inhale 2 puffs into the lungs every 4 (four) hours as needed for wheezing or shortness of breath. 09/07/16  Yes Hayden Rasmussenavid Aziah Kaiser, NP  spironolactone (ALDACTONE) 25 MG tablet Take 1 tablet (25 mg total) by mouth daily. 08/13/16   Henrietta HooverLinda C Bernhardt, NP   Meds Ordered and Administered this Visit  Medications - No data to display  BP 155/95 (BP Location: Left Arm)   Pulse 98   Temp 98.8 F (37.1 C) (Oral)   Resp 18   SpO2 98%  No data found.   Physical Exam  Constitutional: He is oriented to person, place, and time. He appears well-developed and well-nourished. No distress.  HENT:  Head: Normocephalic and atraumatic.  Mouth/Throat: Oropharynx is clear and moist.  Neck: Neck supple.  Cardiovascular: Normal rate and regular rhythm.   Pulmonary/Chest: Effort normal and breath sounds normal. No respiratory distress.  Musculoskeletal: Normal range of motion.  Neurological: He is alert and oriented to person, place, and time.  Skin: Skin is warm and dry.  Psychiatric: He has a normal mood and affect.  Nursing note and vitals reviewed.   Urgent Care Course   Clinical Course     Procedures (including critical care time)  Labs Review Labs Reviewed -  No data to display  Imaging Review No results found.   Visual Acuity Review  Right Eye Distance:   Left Eye Distance:   Bilateral Distance:    Right Eye Near:   Left Eye Near:    Bilateral Near:         MDM   1. Viral URI    Patient states that he was told not to come back to work until he got better and that he needs to see a doctor to get a note to be out of work. Based on this criteria he is given another 2 days  out. Read the instructions given 3 days ago. No change at this time. Take your medications as directed on an as-needed basis for your symptoms. Start drinking plenty fluids and stay well-hydrated.     Hayden Rasmussenavid Krish Bailly, NP 09/10/16 [phone]

## 2016-09-10 NOTE — ED Triage Notes (Addendum)
Pt was treated here three days ago for an upper respiratory infection.  Pt is here today for continued diarrhea. He would like to be treated for the diarrhea and a work note until it clears up.

## 2016-09-10 NOTE — Discharge Instructions (Signed)
Read the instructions given 3 days ago. No change at this time. Take your medications as directed on an as-needed basis for your symptoms. Start drinking plenty fluids and stay well-hydrated.

## 2016-10-09 ENCOUNTER — Telehealth: Payer: Self-pay

## 2016-10-09 NOTE — Telephone Encounter (Signed)
Called, no answer. Left message for patient to call back. I need more information. Thanks!

## 2016-10-09 NOTE — Telephone Encounter (Signed)
Cody [name],  Patient states you were going to order a Sleep study at his appointment on 08/13/2016 I don't see this was done. Please advise. Patient states he is having trouble sleeping.   Also is asking for a refill for eczema cream. I don't see this on his current meds list. He has used triamcinolone previously. Please advise. Thanks!

## 2016-11-22 ENCOUNTER — Ambulatory Visit: Payer: No Typology Code available for payment source | Admitting: Family Medicine

## 2016-12-30 ENCOUNTER — Ambulatory Visit: Payer: No Typology Code available for payment source | Admitting: Family Medicine

## 2016-12-31 ENCOUNTER — Encounter: Payer: Self-pay | Admitting: Family Medicine

## 2016-12-31 ENCOUNTER — Ambulatory Visit (INDEPENDENT_AMBULATORY_CARE_PROVIDER_SITE_OTHER): Payer: No Typology Code available for payment source | Admitting: Family Medicine

## 2016-12-31 VITALS — BP 142/78 | HR 94 | Temp 98.8°F | Resp 22 | Ht 75.5 in | Wt >= 6400 oz

## 2016-12-31 DIAGNOSIS — R7303 Prediabetes: Secondary | ICD-10-CM

## 2016-12-31 DIAGNOSIS — R059 Cough, unspecified: Secondary | ICD-10-CM

## 2016-12-31 DIAGNOSIS — R0609 Other forms of dyspnea: Secondary | ICD-10-CM | POA: Insufficient documentation

## 2016-12-31 DIAGNOSIS — R509 Fever, unspecified: Secondary | ICD-10-CM

## 2016-12-31 DIAGNOSIS — H65111 Acute and subacute allergic otitis media (mucoid) (sanguinous) (serous), right ear: Secondary | ICD-10-CM

## 2016-12-31 DIAGNOSIS — I10 Essential (primary) hypertension: Secondary | ICD-10-CM

## 2016-12-31 DIAGNOSIS — R05 Cough: Secondary | ICD-10-CM

## 2016-12-31 DIAGNOSIS — R600 Localized edema: Secondary | ICD-10-CM

## 2016-12-31 DIAGNOSIS — J029 Acute pharyngitis, unspecified: Secondary | ICD-10-CM

## 2016-12-31 LAB — POCT URINALYSIS DIP (DEVICE)
GLUCOSE, UA: NEGATIVE mg/dL
LEUKOCYTES UA: NEGATIVE
Nitrite: NEGATIVE
PROTEIN: 100 mg/dL — AB
Specific Gravity, Urine: 1.025 (ref 1.005–1.030)
Urobilinogen, UA: 4 mg/dL — ABNORMAL HIGH (ref 0.0–1.0)
pH: 5.5 (ref 5.0–8.0)

## 2016-12-31 LAB — COMPLETE METABOLIC PANEL WITH GFR
ALT: 14 U/L (ref 9–46)
AST: 17 U/L (ref 10–35)
Albumin: 3.4 g/dL — ABNORMAL LOW (ref 3.6–5.1)
Alkaline Phosphatase: 74 U/L (ref 40–115)
BILIRUBIN TOTAL: 0.7 mg/dL (ref 0.2–1.2)
BUN: 10 mg/dL (ref 7–25)
CO2: 26 mmol/L (ref 20–31)
Calcium: 8.8 mg/dL (ref 8.6–10.3)
Chloride: 101 mmol/L (ref 98–110)
Creat: 0.83 mg/dL (ref 0.70–1.33)
GFR, Est African American: >89 mL/min (ref >=60)
GLUCOSE: 105 mg/dL — AB (ref 65–99)
Potassium: 3.8 mmol/L (ref 3.5–5.3)
SODIUM: 137 mmol/L (ref 135–146)
TOTAL PROTEIN: 7.4 g/dL (ref 6.1–8.1)

## 2016-12-31 LAB — POCT INFLUENZA A/B
Influenza A, POC: NEGATIVE
Influenza B, POC: NEGATIVE

## 2016-12-31 LAB — POCT GLYCOSYLATED HEMOGLOBIN (HGB A1C): HEMOGLOBIN A1C: 5.5

## 2016-12-31 MED ORDER — AMOXICILLIN-POT CLAVULANATE 875-125 MG PO TABS: 1.0000 | ORAL_TABLET | Freq: Two times a day (BID) | ORAL | 0 refills | Status: DC

## 2016-12-31 MED ORDER — ALBUTEROL SULFATE HFA 108 (90 BASE) MCG/ACT IN AERS: 2.0000 | INHALATION_SPRAY | RESPIRATORY_TRACT | 2 refills | Status: DC | PRN

## 2016-12-31 MED ORDER — GUAIFENESIN-DM 100-10 MG/5ML PO SYRP: 5.0000 mL | ORAL_SOLUTION | ORAL | 0 refills | Status: DC | PRN

## 2016-12-31 MED ORDER — SPIRONOLACTONE 25 MG PO TABS: 25.0000 mg | ORAL_TABLET | Freq: Every day | ORAL | 1 refills | Status: DC

## 2016-12-31 MED FILL — ROBAFEN-DM SYRUP: 100-10 | 4 days supply | Qty: 118 | Fill #0

## 2016-12-31 MED FILL — AMOX-CLAV 875-125 MG TABLET: 875-125 | 10 days supply | Qty: 20 | Fill #0

## 2016-12-31 MED FILL — VENTOLIN HFA 90 MCG INHALER: 108 (90 BAS | 16 days supply | Qty: 18 | Fill #0

## 2016-12-31 MED FILL — SPIRONOLACTONE 25 MG TABLET: 25 | 30 days supply | Qty: 30 | Fill #0

## 2016-12-31 NOTE — Progress Notes (Signed)
Subjective:    Patient ID: Cody [name], male    DOB: 04/22/1961, 56 y.o.   MRN: [id number] Cody [name], a 56 year old patient with a history of hypertension presents for a follow up and is complaining of upper respiratory symptoms.  He has not been taking antihypertensive medication consistently since upper respiratory symptoms began. 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  Cough  This is a new problem. The current episode started in the past 7 days. Associated symptoms include chills, ear congestion, ear pain, a fever, nasal congestion and postnasal drip. Pertinent negatives include no heartburn, hemoptysis, myalgias, rhinorrhea, shortness of breath, weight loss or wheezing. There is no history of asthma, bronchiectasis, bronchitis, COPD, emphysema, environmental allergies or pneumonia.  Sore Throat   This is a recurrent problem. The current episode started in the past 7 days. The maximum temperature recorded prior to his arrival was 100.4 - 100.9 F. The pain is at a severity of 3/10. Associated symptoms include coughing, ear pain and trouble swallowing. Pertinent negatives include no diarrhea, drooling, ear discharge, neck pain, shortness of breath or swollen glands.   Past Medical History:  Diagnosis Date  . Hypertension    Immunization History  Administered Date(s) Administered  . Tdap 08/13/2016    No Known Allergies  Social History   Social History  . Marital status: Single    Spouse name: N/A  . Number of children: N/A  . Years of education: N/A   Occupational History  . Not on file.   Social History Main Topics  . Smoking status: Former Games developermoker  . Smokeless tobacco: Never Used  . Alcohol use 0.0 oz/week     Comment: occ  . Drug use: No  . Sexual activity: Not on file   Other Topics Concern  . Not on file   Social History Narrative  . No narrative on file   Review of Systems  Constitutional: Positive for chills, fever and unexpected weight  change (weight gain). Negative for fatigue and weight loss.  HENT: Positive for ear pain, postnasal drip and trouble swallowing. Negative for drooling, ear discharge and rhinorrhea.   Eyes: Negative.  Negative for photophobia and visual disturbance.  Respiratory: Positive for cough. Negative for hemoptysis, shortness of breath and wheezing.   Cardiovascular: Negative.   Gastrointestinal: Positive for constipation. Negative for diarrhea, heartburn, nausea and rectal pain.  Endocrine: Positive for polyphagia and polyuria. Negative for cold intolerance, heat intolerance and polydipsia.  Genitourinary: Negative.   Musculoskeletal: Negative.  Negative for myalgias, neck pain and neck stiffness.  Skin: Negative.   Allergic/Immunologic: Negative.  Negative for environmental allergies.  Neurological: Negative.  Negative for dizziness.  Hematological: Negative.   Psychiatric/Behavioral: Negative.  Negative for agitation, sleep disturbance and suicidal ideas.       Objective:   Physical Exam  Constitutional: He is oriented to person, place, and time. He appears well-developed and well-nourished.  Morbid obesity  HENT:  Head: Normocephalic and atraumatic.  Right Ear: Hearing and external ear normal. Tympanic membrane is erythematous.  Left Ear: External ear normal. Tympanic membrane is erythematous.  Nose: Nose normal.  Mouth/Throat: Posterior oropharyngeal edema present.  Eyes: Conjunctivae and EOM are normal. Pupils are equal, round, and reactive to light.  Neck: Normal range of motion. Neck supple.  Pulmonary/Chest: Effort normal and breath sounds normal.  Abdominal: Soft. Bowel sounds are normal.  Increased abdominal girth  Musculoskeletal: Normal range of motion.  Neurological: He is  alert and oriented to person, place, and time. He has normal reflexes.  Skin: Skin is warm, dry and intact.  Psychiatric: He has a normal mood and affect. His behavior is normal. Judgment and thought content  normal.      BP (!) 142/78 (BP Location: Right Arm, Patient Position: Sitting, Cuff Size: Large)   Pulse 94   Temp 98.8 F (37.1 C) (Oral)   Resp (!) 22   Ht 6' 3.5" (1.918 m)   Wt (!) 503 lb (228.2 kg)   SpO2 93%   BMI 62.04 kg/m  Assessment & Plan:  1. Essential hypertension Blood pressure is at goal on current medication regimen. The patient is asked to make an attempt to improve diet and exercise patterns to aid in medical management of this problem. - Microalbumin/Creatinine Ratio, Urine - COMPLETE METABOLIC PANEL WITH GFR - spironolactone (ALDACTONE) 25 MG tablet; Take 1 tablet (25 mg total) by mouth daily.  Dispense: 90 tablet; Refill: 1  2. Prediabetes Recommend a lowfat, low carbohydrate diet divided over 5-6 small meals, increase water intake to 6-8 glasses, and 150 minutes per week of cardiovascular exercise.   - HgB A1c  3. Fever and chills  - Influenza A/B  4. Cough - Influenza A/B  5. Pedal edema Continue spironolactone as prescribed.   6. Dyspnea on exertion - albuterol (PROVENTIL HFA;VENTOLIN HFA) 108 (90 Base) MCG/ACT inhaler; Inhale 2 puffs into the lungs every 4 (four) hours as needed for wheezing or shortness of breath.  Dispense: 1 Inhaler; Refill: 2  7. Acute mucoid otitis media of right ear - amoxicillin-clavulanate (AUGMENTIN) 875-125 MG tablet; Take 1 tablet by mouth 2 (two) times daily.  Dispense: 20 tablet; Refill: 0  8. Cough with fever - guaiFENesin-dextromethorphan (ROBITUSSIN DM) 100-10 MG/5ML syrup; Take 5 mLs by mouth every 4 (four) hours as needed for cough.  Dispense: 118 mL; Refill: 0  9. Sore throat Recommend salt water gargles as needed.      The patient was given clear instructions to go to ER or return to medical center if symptoms do not improve, worsen or new problems develop. The patient verbalized understanding. Will notify patient with laboratory results.    Massie Maroon, FNP

## 2016-12-31 NOTE — Patient Instructions (Addendum)
Start Augmentin every 12 hours Increase rest, fluid intake and handwashing. Recommend gargles with salt water as needed for throat inflammation. Also, tylenol 500 mg every 6 hours as needed for fever.     Otitis Media, Adult Otitis media occurs when there is inflammation and fluid in the middle ear. Your middle ear is a part of the ear that contains bones for hearing as well as air that helps send sounds to your brain. What are the causes? This condition is caused by a blockage in the eustachian tube. This tube drains fluid from the ear to the back of the nose (nasopharynx). A blockage in this tube can be caused by an object or by swelling (edema) in the tube. Problems that can cause a blockage include:  A cold or other upper respiratory infection.  Allergies.  An irritant, such as tobacco smoke.  Enlarged adenoids. The adenoids are areas of soft tissue located high in the back of the throat, behind the nose and the roof of the mouth.  A mass in the nasopharynx.  Damage to the ear caused by pressure changes (barotrauma). What are the signs or symptoms? Symptoms of this condition include:  Ear pain.  A fever.  Decreased hearing.  A headache.  Tiredness (lethargy).  Fluid leaking from the ear.  Ringing in the ear. How is this diagnosed? This condition is diagnosed with a physical exam. During the exam your health care provider will use an instrument called an otoscope to look into your ear and check for redness, swelling, and fluid. He or she will also ask about your symptoms. Your health care provider may also order tests, such as:  A test to check the movement of the eardrum (pneumatic otoscopy). This test is done by squeezing a small amount of air into the ear.  A test that changes air pressure in the middle ear to check how well the eardrum moves and whether the eustachian tube is working (tympanogram). How is this treated? This condition usually goes away on its own  within 3-5 days. But if the condition is caused by a bacteria infection and does not go away own its own, or keeps coming back, your health care provider may:  Prescribe antibiotic medicines to treat the infection.  Prescribe or recommend medicines to control pain. Follow these instructions at home:  Take over-the-counter and prescription medicines only as told by your health care provider.  If you were prescribed an antibiotic medicine, take it as told by your health care provider. Do not stop taking the antibiotic even if you start to feel better.  Keep all follow-up visits as told by your health care provider. This is important. Contact a health care provider if:  You have bleeding from your nose.  There is a lump on your neck.  You are not getting better in 5 days.  You feel worse instead of better. Get help right away if:  You have severe pain that is not controlled with medicine.  You have swelling, redness, or pain around your ear.  You have stiffness in your neck.  A part of your face is paralyzed.  The bone behind your ear (mastoid) is tender when you touch it.  You develop a severe headache. Summary  Otitis media is redness, soreness, and swelling of the middle ear.  This condition usually goes away on its own within 3-5 days.  If the problem does not go away in 3-5 days, your health care provider may prescribe or  recommend medicines to treat your symptoms.  If you were prescribed an antibiotic medicine, take it as told by your health care provider. This information is not intended to replace advice given to you by your health care provider. Make sure you discuss any questions you have with your health care provider. Document Released: 07/26/2004 Document Revised: 10/11/2016 Document Reviewed: 10/11/2016 Elsevier Interactive Patient Education  2017 [employer].

## 2017-01-01 LAB — MICROALBUMIN / CREATININE URINE RATIO
Creatinine, Urine: 541 mg/dL — ABNORMAL HIGH (ref 20–370)
MICROALB/CREAT RATIO: 11 ug/mg{creat} (ref <30)
Microalb, Ur: 6.2 mg/dL

## 2017-01-06 ENCOUNTER — Other Ambulatory Visit: Payer: Self-pay | Admitting: Family Medicine

## 2017-01-06 DIAGNOSIS — R509 Fever, unspecified: Principal | ICD-10-CM

## 2017-01-06 DIAGNOSIS — R05 Cough: Secondary | ICD-10-CM

## 2017-03-05 ENCOUNTER — Telehealth: Payer: Self-pay

## 2017-03-05 DIAGNOSIS — R0609 Other forms of dyspnea: Secondary | ICD-10-CM

## 2017-03-05 DIAGNOSIS — I10 Essential (primary) hypertension: Secondary | ICD-10-CM

## 2017-03-05 MED ORDER — ALBUTEROL SULFATE HFA 108 (90 BASE) MCG/ACT IN AERS: 2.0000 | INHALATION_SPRAY | RESPIRATORY_TRACT | 2 refills | Status: DC | PRN

## 2017-03-05 MED ORDER — SPIRONOLACTONE 25 MG PO TABS: 25.0000 mg | ORAL_TABLET | Freq: Every day | ORAL | 1 refills | Status: DC

## 2017-03-05 MED FILL — ?SPIRONOLACTONE 25 MG TABLE: 25 | 30 days supply | Qty: 30 | Fill #0

## 2017-03-05 NOTE — Telephone Encounter (Signed)
I have sent in rx's that can be refilled. Thanks!

## 2017-03-17 ENCOUNTER — Ambulatory Visit: Payer: No Typology Code available for payment source | Admitting: Family Medicine

## 2017-03-26 ENCOUNTER — Ambulatory Visit (INDEPENDENT_AMBULATORY_CARE_PROVIDER_SITE_OTHER): Payer: Self-pay | Admitting: Family Medicine

## 2017-03-26 ENCOUNTER — Encounter: Payer: Self-pay | Admitting: Family Medicine

## 2017-03-26 VITALS — BP 136/78 | HR 79 | Temp 98.4°F | Resp 16 | Ht 75.5 in | Wt >= 6400 oz

## 2017-03-26 DIAGNOSIS — M79671 Pain in right foot: Secondary | ICD-10-CM

## 2017-03-26 DIAGNOSIS — M79672 Pain in left foot: Secondary | ICD-10-CM

## 2017-03-26 DIAGNOSIS — R319 Hematuria, unspecified: Secondary | ICD-10-CM

## 2017-03-26 DIAGNOSIS — R600 Localized edema: Secondary | ICD-10-CM

## 2017-03-26 DIAGNOSIS — I10 Essential (primary) hypertension: Secondary | ICD-10-CM

## 2017-03-26 DIAGNOSIS — W57XXXA Bitten or stung by nonvenomous insect and other nonvenomous arthropods, initial encounter: Secondary | ICD-10-CM

## 2017-03-26 LAB — BASIC METABOLIC PANEL WITH GFR
BUN: 15 mg/dL (ref 7–25)
CHLORIDE: 105 mmol/L (ref 98–110)
CO2: 23 mmol/L (ref 20–31)
Calcium: 9.2 mg/dL (ref 8.6–10.3)
Creat: 0.89 mg/dL (ref 0.70–1.33)
GFR, Est African American: >89 mL/min (ref >=60)
GLUCOSE: 97 mg/dL (ref 65–99)
POTASSIUM: 4.2 mmol/L (ref 3.5–5.3)
Sodium: 138 mmol/L (ref 135–146)

## 2017-03-26 LAB — CBC WITH DIFFERENTIAL/PLATELET
BASOS ABS: 0 {cells}/uL (ref 0–200)
Basophils Relative: 0 %
Eosinophils Absolute: 118 cells/uL (ref 15–500)
Eosinophils Relative: 2 %
HEMATOCRIT: 43 % (ref 38.5–50.0)
Hemoglobin: 14 g/dL (ref 13.2–17.1)
LYMPHS ABS: 2773 {cells}/uL (ref 850–3900)
Lymphocytes Relative: 47 %
MCH: 29.6 pg (ref 27.0–33.0)
MCHC: 32.6 g/dL (ref 32.0–36.0)
MCV: 90.9 fL (ref 80.0–100.0)
MONO ABS: 590 {cells}/uL (ref 200–950)
MPV: 9.8 fL (ref 7.5–12.5)
Monocytes Relative: 10 %
NEUTROS PCT: 41 %
Neutro Abs: 2419 cells/uL (ref 1500–7800)
Platelets: 250 10*3/uL (ref 140–400)
RBC: 4.73 MIL/uL (ref 4.20–5.80)
RDW: 16.2 % — ABNORMAL HIGH (ref 11.0–15.0)
WBC: 5.9 10*3/uL (ref 3.8–10.8)

## 2017-03-26 LAB — POCT URINALYSIS DIP (DEVICE)
Bilirubin Urine: NEGATIVE
GLUCOSE, UA: NEGATIVE mg/dL
Ketones, ur: NEGATIVE mg/dL
Leukocytes, UA: NEGATIVE
Nitrite: NEGATIVE
PROTEIN: NEGATIVE mg/dL
UROBILINOGEN UA: 0.2 mg/dL (ref 0.0–1.0)
pH: 5.5 (ref 5.0–8.0)

## 2017-03-26 MED ORDER — ACETAMINOPHEN 500 MG PO TABS: 500.0000 mg | ORAL_TABLET | Freq: Four times a day (QID) | ORAL | 0 refills | Status: DC | PRN

## 2017-03-26 NOTE — Patient Instructions (Addendum)
Discontinue all non steroidal anti inflammatory medications (Ibuprofen, Aleve, Naproxen) Recommend Tylenol 500 mg every 6 hours as needed.   Eczema: Apply vaseline to extensor surfaces. Will also send triamcinolone cream to pharmacy  Recommend a lowfat, low carbohydrate diet divided over 5-6 small meals, increase water intake to 6-8 glasses, and 150 minutes per week of cardiovascular exercise.   Will follow up with laboratory results by phone   Hematuria, Adult Hematuria is blood in your urine. It can be caused by a bladder infection, kidney infection, prostate infection, kidney stone, or cancer of your urinary tract. Infections can usually be treated with medicine, and a kidney stone usually will pass through your urine. If neither of these is the cause of your hematuria, further workup to find out the reason may be needed. It is very important that you tell your health care provider about any blood you see in your urine, even if the blood stops without treatment or happens without causing pain. Blood in your urine that happens and then stops and then happens again can be a symptom of a very serious condition. Also, pain is not a symptom in the initial stages of many urinary cancers. Follow these instructions at home:  Drink lots of fluid, 3-4 quarts a day. If you have been diagnosed with an infection, cranberry juice is especially recommended, in addition to large amounts of water.  Avoid caffeine, tea, and carbonated beverages because they tend to irritate the bladder.  Avoid alcohol because it may irritate the prostate.  Take all medicines as directed by your health care provider.  If you were prescribed an antibiotic medicine, finish it all even if you start to feel better.  If you have been diagnosed with a kidney stone, follow your health care provider's instructions regarding straining your urine to catch the stone.  Empty your bladder often. Avoid holding urine for long periods of  time.  After a bowel movement, women should cleanse front to back. Use each tissue only once.  Empty your bladder before and after sexual intercourse if you are a male. Contact a health care provider if:  You develop back pain.  You have a fever.  You have a feeling of sickness in your stomach (nausea) or vomiting.  Your symptoms are not better in 3 days. Return sooner if you are getting worse. Get help right away if:  You develop severe vomiting and are unable to keep the medicine down.  You develop severe back or abdominal pain despite taking your medicines.  You begin passing a large amount of blood or clots in your urine.  You feel extremely weak or faint, or you pass out. This information is not intended to replace advice given to you by your health care provider. Make sure you discuss any questions you have with your health care provider. Document Released: 10/21/2005 Document Revised: 03/28/2016 Document Reviewed: 06/21/2013 Elsevier Interactive Patient Education  2017 [employer]

## 2017-03-26 NOTE — Progress Notes (Signed)
Subjective:    Patient ID: Cody [name], male    DOB: 07/07/1961, 56 y.o.   MRN: [id number]  Hypertension  Pertinent negatives include no neck pain.    [name], a 56 year old patient with a history of hypertension and bilateral lower extremity edema presents for a follow up of chronic conditions. He is complaining of a healing tick bite that occurred several weeks ago. The tick was visualized on left ankle. The patient was able to remove the tick without complication. He is concerned about discoloration to the area around the bite. He denies fever, fatigue, joint pain redness, itching, or irritation.   [name] is also following up for hypertension. He has been taking Spironolactone consistently. He endorses swelling to bilaterally lower extremities. He notices swelling mostly after working long shift. He denies dizziness, fatigue, syncope, dyspnea, or chest pain.    [name] is currently complaining of weight gain. Patient cites bilateral lower extremity edema and ankle pain as his primary reason for wanting to lose weight. He states that prior to moving to this area, he was on Phentermine for appetite suppression. He says that he has been consistently gaining weight over the past several months. He says that he does not exercise and has a sedentary lifestyle.  Unsuccessful weight loss techniques attempted: nutritionist consultation, self-directed dieting and supervised diet program.  [name] typically eats 1-2 large meals throughout the day.   Past Medical History:  Diagnosis Date  . Hypertension    Immunization History  Administered Date(s) Administered  . Tdap 08/13/2016    No Known Allergies  Social History   Social History  . Marital status: Single    Spouse name: N/A  . Number of children: N/A  . Years of education: N/A   Occupational History  . Not on file.   Social History Main Topics  . Smoking status: Former Games developer  . Smokeless tobacco:  Never Used  . Alcohol use 0.0 oz/week     Comment: occ  . Drug use: No  . Sexual activity: Not on file   Other Topics Concern  . Not on file   Social History Narrative  . No narrative on file   Review of Systems  Constitutional: Positive for unexpected weight change (weight gain). Negative for fatigue.  HENT: Negative.   Eyes: Negative.  Negative for photophobia and visual disturbance.  Respiratory: Negative.   Cardiovascular: Negative.   Gastrointestinal: Positive for constipation. Negative for diarrhea, nausea and rectal pain.  Endocrine: Negative for cold intolerance, heat intolerance and polydipsia.  Genitourinary: Negative.   Musculoskeletal: Negative.  Negative for neck pain and neck stiffness.  Skin: Negative.        Tick bite to lower extremity  Allergic/Immunologic: Negative.   Neurological: Negative.  Negative for dizziness.  Hematological: Negative.   Psychiatric/Behavioral: Negative.  Negative for agitation, sleep disturbance and suicidal ideas.       Objective:   Physical Exam  Constitutional: He is oriented to person, place, and time. He appears well-developed and well-nourished.  Morbid obesity  HENT:  Head: Normocephalic and atraumatic.  Right Ear: External ear normal.  Left Ear: External ear normal.  Nose: Nose normal.  Mouth/Throat: Oropharynx is clear and moist.  Eyes: Conjunctivae and EOM are normal. Pupils are equal, round, and reactive to light.  Neck: Normal range of motion. Neck supple.  Pulmonary/Chest: Effort normal and breath sounds normal.  Abdominal: Soft. Bowel sounds are normal.  Increased abdominal girth  Musculoskeletal: Normal range of motion.  Neurological: He is alert and oriented to person, place, and time. He has normal reflexes.  Skin: Skin is warm, dry and intact.  Hyperpigmentation to left ankle, non tender to palpation.   Psychiatric: He has a normal mood and affect. His behavior is normal. Judgment and thought content  normal.      BP 136/78 (BP Location: Left Arm, Patient Position: Sitting, Cuff Size: Large) Comment: manual  Pulse 79   Temp 98.4 F (36.9 C) (Oral)   Resp 16   Ht 6' 3.5" (1.918 m)   Wt (!) 533 lb (241.8 kg)   SpO2 96%   BMI 65.74 kg/m  Assessment & Plan:  1. Essential hypertension Blood pressure is at goal on current medication regimen.   - POCT urinalysis dip (device)  2. Lower extremity edema Elevate extremities to heart level while at rest. Continue Spironolactone.   3. Hematuria, unspecified type Will order a urine microscopic to check for RBC casts. Patient may warrant further imaging of kidneys. Will evaluate BUN/Creatinine and GFR, was within a normal range in February.  - Urinalysis, Routine w reflex microscopic - BASIC METABOLIC PANEL WITH GFR - CBC with Differential - PSA - Urine culture  4. Morbid obesity (HCC) Recommend a lowfat, low carbohydrate diet divided over 5-6 small meals, increase water intake to 6-8 glasses, and 150 minutes per week of cardiovascular exercise.   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 Discussed diet and exercise at length.   5. Bilateral foot pain Recommend insoles for shoes.   - acetaminophen (TYLENOL) 500 MG tablet; Take 1 tablet (500 mg total) by mouth every 6 (six) hours as needed.  Dispense: 30 tablet; Refill: 0  6. Insect bite There is no visible irritation, edema, or pain to the site. Does not require further treatment and evaluation at this time.     RTC: 3 months for hypertension. Will follow up by phone with laboratory results.    Nolon NationsLaChina Moore Hollis  MSN, FNP-C [facility] [address]  [place], [place] [id number] [phone]

## 2017-03-27 LAB — URINE CULTURE: Organism ID, Bacteria: NO GROWTH

## 2017-03-27 LAB — URINALYSIS, ROUTINE W REFLEX MICROSCOPIC

## 2017-03-27 LAB — PSA: PSA: 0.3 ng/mL (ref <=4.0)

## 2017-03-30 DIAGNOSIS — R600 Localized edema: Secondary | ICD-10-CM | POA: Insufficient documentation

## 2017-03-30 DIAGNOSIS — R319 Hematuria, unspecified: Secondary | ICD-10-CM | POA: Insufficient documentation

## 2017-04-03 ENCOUNTER — Ambulatory Visit: Payer: No Typology Code available for payment source | Admitting: Family Medicine

## 2017-04-21 ENCOUNTER — Telehealth: Payer: Self-pay

## 2017-04-21 DIAGNOSIS — I10 Essential (primary) hypertension: Secondary | ICD-10-CM

## 2017-04-21 MED ORDER — SPIRONOLACTONE 25 MG PO TABS: 25.0000 mg | ORAL_TABLET | Freq: Every day | ORAL | 1 refills | Status: DC

## 2017-04-21 MED FILL — SPIRONOLACTONE 25 MG TABLET: 25 | 30 days supply | Qty: 30 | Fill #1

## 2017-04-21 NOTE — Telephone Encounter (Signed)
Refill sent into pharmacy. Thanks!  

## 2017-04-28 ENCOUNTER — Ambulatory Visit: Payer: No Typology Code available for payment source | Admitting: Family Medicine

## 2017-04-29 DIAGNOSIS — M79673 Pain in unspecified foot: Secondary | ICD-10-CM

## 2017-04-30 MED FILL — !VENTOLIN HFA INHALER: 108 (90 BAS | 25 days supply | Qty: 18 | Fill #0

## 2017-05-06 ENCOUNTER — Encounter (HOSPITAL_COMMUNITY): Payer: Self-pay | Admitting: Emergency Medicine

## 2017-05-06 ENCOUNTER — Ambulatory Visit (HOSPITAL_COMMUNITY)
Admission: EM | Admit: 2017-05-06 | Discharge: 2017-05-06 | Disposition: A | Discharge status: Home / Self Care | Payer: No Typology Code available for payment source | Attending: Internal Medicine | Admitting: Internal Medicine

## 2017-05-06 DIAGNOSIS — B356 Tinea cruris: Secondary | ICD-10-CM

## 2017-05-06 MED ORDER — CLOTRIMAZOLE 1 % EX CREA: TOPICAL_CREAM | CUTANEOUS | 0 refills | Status: DC

## 2017-05-06 MED ORDER — GOLD BOND EX POWD: Freq: Two times a day (BID) | CUTANEOUS | 0 refills | Status: DC

## 2017-05-06 MED FILL — ?FUROSEMIDE 20 MG TABLET: 20 | 30 days supply | Qty: 30 | Fill #0

## 2017-05-06 MED FILL — CLOTRIMAZOLE/BETAMETH CREAM: 1-0.05 | 30 days supply | Qty: 30 | Fill #0

## 2017-05-06 NOTE — ED Provider Notes (Signed)
CSN: [id number]     Arrival date & time 05/06/17  1131 History   First MD Initiated Contact with Patient 05/06/17 1228     Chief Complaint  Patient presents with  . Rash   (Consider location/radiation/quality/duration/timing/severity/associated sxs/prior Treatment) HPI Cody [name] is a 56 y.o. male presenting to UC with c/o gradually worsening itching rash in his groin area for about 2 weeks.  Pt believes he has heat rash. He has been using non-medicated Gold Bond powder with minimal relief. He of similar rash in the past but has never needed a prescription medication for it.  Denies hx of DM.  No new soaps, lotions, or medications.   Past Medical History:  Diagnosis Date  . Hypertension    Past Surgical History:  Procedure Laterality Date  . ANKLE FUSION     left ankle 1980'S  . FOOT BONE EXCISION     History reviewed. No pertinent family history. Social History  Substance Use Topics  . Smoking status: Former Games developermoker  . Smokeless tobacco: Never Used  . Alcohol use 0.0 oz/week     Comment: occ    Review of Systems  Genitourinary: Negative for discharge, dysuria, genital sores, penile pain, penile swelling, scrotal swelling and testicular pain.  Skin: Positive for rash. Negative for wound.    Allergies  Patient has no known allergies.  Home Medications   Prior to Admission medications   Medication Sig Start Date End Date Taking? Authorizing Provider  furosemide (LASIX) 20 MG tablet Take 20 mg by mouth.   Yes [provider]  spironolactone (ALDACTONE) 25 MG tablet Take 1 tablet (25 mg total) by mouth daily. 04/21/17  Yes [name], [name], FNP  clotrimazole (LOTRIMIN) 1 % cream Apply to affected area 2 times daily for 2 weeks 05/06/17   Lurene ShadowPhelps, Dallas Scorsone O, PA-C  menthol-zinc oxide (GOLD BOND) powder Apply topically 2 (two) times daily. 05/06/17   Lurene ShadowPhelps, Dellia Donnelly O, PA-C   Meds Ordered and Administered this Visit  Medications - No data to display  BP (!) 151/91 (BP  Location: Left Arm)   Pulse 79   Temp 98.6 F (37 C) (Oral)   Resp 20   SpO2 100%  No data found.   Physical Exam  Constitutional: He is oriented to person, place, and time. He appears well-developed and well-nourished.  HENT:  Head: Normocephalic and atraumatic.  Eyes: EOM are normal.  Neck: Normal range of motion.  Cardiovascular: Normal rate.   Pulmonary/Chest: Effort normal.  Genitourinary:     Genitourinary Comments: Hyperpigmented rash in bilateral inguinal folds. No bleeding or discharge. No open sores.  Musculoskeletal: Normal range of motion.  Neurological: He is alert and oriented to person, place, and time.  Skin: Skin is warm and dry.  Psychiatric: He has a normal mood and affect. His behavior is normal.  Nursing note and vitals reviewed.   Urgent Care Course     Procedures (including critical care time)  Labs Review Labs Reviewed - No data to display  Imaging Review No results found.    MDM   1. Tinea cruris    Hx and exam c/w tinea cruris  Rx: clotrimazole cream for 2 weeks. Pt also requested prescription for medicated Gold Bond powder if needed for later.  Home care instructions provided F/u with PCP as needed.    Lurene Shadowhelps, Sehaj Kolden O, PA-C 05/06/17 1430

## 2017-05-06 NOTE — ED Notes (Signed)
Pt    Called requesting   A  rx     Not  otc  Med        erin  spoke   [facility]  [facility]   [facility]      To  rx   A  Cream for  Pt  Pt  So  Advised

## 2017-05-06 NOTE — ED Triage Notes (Signed)
[facility] a [facility] of a rash in his groin area x 2 weeks. The patient stated that he believed it to be a heat rash.

## 2017-05-08 ENCOUNTER — Ambulatory Visit: Payer: No Typology Code available for payment source | Admitting: Family Medicine

## 2017-05-12 ENCOUNTER — Telehealth (HOSPITAL_COMMUNITY): Payer: Self-pay | Admitting: Emergency Medicine

## 2017-05-12 MED FILL — NYSTOP 100,000 UNITS/GM PWD: 100000 | 30 days supply | Qty: 45 | Fill #0

## 2017-05-12 NOTE — Telephone Encounter (Signed)
Pt called... Sts he was seen here on 7/3 for a rash in the groin  Was given clotrimazole 1% that he was able to p/u but was unable to p/u menthol-zinc oxide b/c it was OTC and [facility] is unable to assist  Wanted to know if we could call in a Rx strength powder.   Per Dr. Dayton ScrapeMurray, ok to call in Nystatin Powder (Nystop) to pharmacy  Apply BID light dust topically, 45 g, no refill  [facility] who assisted and also notified pt.

## 2017-05-28 ENCOUNTER — Encounter: Payer: Self-pay | Admitting: Family Medicine

## 2017-05-28 ENCOUNTER — Ambulatory Visit (INDEPENDENT_AMBULATORY_CARE_PROVIDER_SITE_OTHER): Payer: Self-pay | Admitting: Family Medicine

## 2017-05-28 VITALS — BP 132/76 | HR 88 | Temp 98.3°F | Resp 16 | Ht 75.5 in | Wt >= 6400 oz

## 2017-05-28 DIAGNOSIS — M19079 Primary osteoarthritis, unspecified ankle and foot: Secondary | ICD-10-CM

## 2017-05-28 MED ORDER — MELOXICAM 7.5 MG PO TABS: 7.5000 mg | ORAL_TABLET | Freq: Every day | ORAL | 0 refills | Status: DC

## 2017-05-28 MED ORDER — KETOROLAC TROMETHAMINE 60 MG/2ML IM SOLN
60.0000 mg | Freq: Once | INTRAMUSCULAR | Status: AC
  Administered 2017-05-28: 60 mg via INTRAMUSCULAR

## 2017-05-28 MED FILL — MELOXICAM 7.5 MG TABLET: 7.5 | 30 days supply | Qty: 30 | Fill #0

## 2017-05-28 NOTE — Patient Instructions (Addendum)
Will start a trial of meloxicam 7.5 mg daily for left foot pain and inflammation  Information for DOT physical:  Dr. Knox RoyaltyEnrico Jones [address] [place], [place]  [phone] without insurance  Meloxicam capsules What is this medicine? MELOXICAM (mel OX i cam) is a non-steroidal anti-inflammatory drug (NSAID). It is used to reduce swelling and to treat pain. It is used for osteoarthritis. This medicine may be used for other purposes; ask your health care provider or pharmacist if you have questions. COMMON BRAND NAME(S): Vivlodex What should I tell my health care provider before I take this medicine? They need to know if you have any of these conditions: -bleeding disorders -cigarette smoker -coronary artery bypass graft (CABG) surgery within the past 2 weeks -drink more than 3 alcohol-containing drinks per day -heart disease -high blood pressure -history of stomach bleeding -kidney disease -liver disease -lung or breathing disease, like asthma -stomach or intestine problems -an unusual or allergic reaction to meloxicam, aspirin, other NSAIDs, other medicines, foods, dyes, or preservatives -pregnant or trying to get pregnant -breast-feeding How should I use this medicine? Take this medicine by mouth with a full glass of water. Follow the directions on the prescription label. You can take it with or without food. If it upsets your stomach, take it with food. Take your medicine at regular intervals. Do not take it more often than directed. Do not stop taking except on your doctor's advice. A special MedGuide will be given to you by the pharmacist with each prescription and refill. Be sure to read this information carefully each time. Talk to your pediatrician regarding the use of this medicine in children. Special care may be needed. Patients over 55 years old may have a stronger reaction and need a smaller dose. Overdosage: If you think you have taken too much of this  medicine contact a poison control center or emergency room at once. NOTE: This medicine is only for you. Do not share this medicine with others. What if I miss a dose? If you miss a dose, take it as soon as you can. If it is almost time for your next dose, take only that dose. Do not take double or extra doses. What may interact with this medicine? Do not take this medicine with any of the following medications: -cidofovir -ketorolac This medicine may also interact with the following medications: -aspirin and aspirin-like medicines -certain medicines for blood pressure, heart disease, irregular heart beat -certain medicines for depression, anxiety, or psychotic disturbances -certain medicines that treat or prevent blood clots like warfarin, enoxaparin, dalteparin, apixaban, dabigatran, rivaroxaban -cyclosporine -digoxin -diuretics -methotrexate -other NSAIDs, medicines for pain and inflammation, like ibuprofen and naproxen -pemetrexed This list may not describe all possible interactions. Give your health care provider a list of all the medicines, herbs, non-prescription drugs, or dietary supplements you use. Also tell them if you smoke, drink alcohol, or use illegal drugs. Some items may interact with your medicine. What should I watch for while using this medicine? Tell your doctor or healthcare professional if your symptoms do not start to get better or if they get worse. Do not take other medicines that contain aspirin, ibuprofen, or naproxen with this medicine. Side effects such as stomach upset, nausea, or ulcers may be more likely to occur. Many medicines available without a prescription should not be taken with this medicine. This medicine can cause ulcers and bleeding in the stomach and intestines at any time during treatment. This can happen with no warning  and may cause death. There is increased risk with taking this medicine for a long time. Smoking, drinking alcohol, older age, and  poor health can also increase risks. Call your doctor right away if you have stomach pain or blood in your vomit or stool. This medicine does not prevent heart attack or stroke. In fact, this medicine may increase the chance of a heart attack or stroke. The chance may increase with longer use of this medicine and in people who have heart disease. If you take aspirin to prevent heart attack or stroke, talk with your doctor or health care professional. What side effects may I notice from receiving this medicine? Side effects that you should report to your doctor or health care professional as soon as possible: -allergic reactions like skin rash, itching or hives, swelling of the face, lips, or tongue -nausea, vomiting -signs and symptoms of a blood clot such as breathing problems; changes in vision; chest pain; severe, sudden headache; pain, swelling, warmth in the leg; trouble speaking; sudden numbness or weakness of the face, arm, or leg -signs and symptoms of bleeding such as bloody or black, tarry stools; red or dark-brown urine; spitting up blood or brown material that looks like coffee grounds; red spots on the skin; unusual bruising or bleeding from the eye, gums, or nose -signs and symptoms of liver injury like dark yellow or brown urine; general ill feeling or flu-like symptoms; light-colored stools; loss of appetite; nausea; right upper belly pain; unusually weak or tired; yellowing of the eyes or skin -signs and symptoms of stroke like changes in vision; confusion; trouble speaking or understanding; severe headaches; sudden numbness or weakness of the face, arm, or leg; trouble walking; dizziness; loss of balance or coordination Side effects that usually do not require medical attention (report to your doctor or health care professional if they continue or are bothersome): -constipation -diarrhea -gas This list may not describe all possible side effects. Call your doctor for medical advice  about side effects. You may report side effects to FDA at 1-800-FDA-1088. Where should I keep my medicine? Keep out of the reach of children. Store at room temperature between 15 and 30 degrees C (59 and 86 degrees F). Throw away any unused medicine after the expiration date. NOTE: This sheet is a summary. It may not cover all possible information. If you have questions about this medicine, talk to your doctor, pharmacist, or health care provider.  2018 Elsevier/Gold Standard (2015-11-23 10:31:18)

## 2017-05-28 NOTE — Progress Notes (Signed)
Subjective:    Cody CarwinCharles L [name] is a 56 y.o. male who presents with left foot pain. Onset of the symptoms was several weeks ago. Patient has not identified any precipitating events.  Current symptoms include: ability to bear weight, but with some pain, stiffness and swelling. Aggravating factors: any weight bearing, standing and walking. Symptoms have been intermittent. Patient has had prior foot problems.  Past Medical History:  Diagnosis Date  . Hypertension     Review of Systems    Review of Systems  Constitutional: Negative for diaphoresis.  HENT: Negative.   Eyes: Negative.   Cardiovascular: Negative.   Gastrointestinal: Negative.   Genitourinary: Negative.   Musculoskeletal: Positive for joint pain (left foot).  Skin: Negative.   Neurological: Negative.  Negative for weakness.  Endo/Heme/Allergies: Negative.   Psychiatric/Behavioral: Negative.    Objective:    BP 132/76 (BP Location: Left Arm, Patient Position: Sitting, Cuff Size: Large)   Pulse 88   Temp 98.3 F (36.8 C) (Oral)   Resp 16   Ht 6' 3.5" (1.918 m)   Wt (!) 519 lb (235.4 kg)   SpO2 96%   BMI 64.01 kg/m  Physical Exam  Constitutional: He appears healthy.  HENT:  Nose: Nose normal.  Mouth/Throat: Oropharynx is clear.  Pulmonary/Chest: Breath sounds normal. Increased effort noted. Tenderness is present which does not mimic chest pain.  Abdominal: Soft. Bowel sounds are normal.  Musculoskeletal: He exhibits tenderness.  Soft tissue swelling to left lateral aspect of left foot. Probable bunion formation  Mild swelling to ankle  Neurological: He is alert and oriented to person, place, and time.  Skin: Skin is cool.   Assessment:  Osteoarthritis of left ankle and foot Plan:   Osteoarthritis of ankle and foot, unspecified laterality - ketorolac (TORADOL) injection 60 mg; Inject 2 mLs (60 mg total) into the muscle once. - meloxicam (MOBIC) 7.5 MG tablet; Take 1 tablet (7.5 mg total) by mouth daily.   Dispense: 30 tablet; Refill: 0   Natural history and expected course discussed. Questions answered. Agricultural engineerducational material distributed. Rest, ice, compression, and elevation (RICE) therapy. NSAIDs per medication orders. Follow up in 3 months.    Nolon NationsLaChina Moore Jacqueli Pangallo  MSN, FNP-C [facility] [address]  [place], [place] [id number] [phone]

## 2017-05-30 ENCOUNTER — Encounter: Payer: Self-pay | Admitting: Family Medicine

## 2017-06-30 MED FILL — ?SPIRONOLACTONE 25 MG TABLE: 25 | 30 days supply | Qty: 30 | Fill #2

## 2017-06-30 MED FILL — ?FUROSEMIDE 20 MG TABLET: 20 | 30 days supply | Qty: 30 | Fill #1

## 2017-07-17 ENCOUNTER — Encounter (HOSPITAL_COMMUNITY): Payer: Self-pay | Admitting: Family Medicine

## 2017-07-17 ENCOUNTER — Ambulatory Visit (HOSPITAL_COMMUNITY)
Admission: EM | Admit: 2017-07-17 | Discharge: 2017-07-17 | Disposition: A | Discharge status: Home / Self Care | Payer: No Typology Code available for payment source | Attending: Family Medicine | Admitting: Family Medicine

## 2017-07-17 DIAGNOSIS — B356 Tinea cruris: Secondary | ICD-10-CM

## 2017-07-17 MED ORDER — DICLOFENAC SODIUM 75 MG PO TBEC: 75.0000 mg | DELAYED_RELEASE_TABLET | Freq: Two times a day (BID) | ORAL | 1 refills | Status: DC

## 2017-07-17 MED ORDER — KETOCONAZOLE 2 % EX CREA: 1.0000 "application " | TOPICAL_CREAM | Freq: Two times a day (BID) | CUTANEOUS | 1 refills | Status: DC

## 2017-07-17 MED FILL — ?DICLOFENAC SOD DR 75 MG TA: 75 | 15 days supply | Qty: 30 | Fill #0

## 2017-07-17 MED FILL — KETOCONAZOLE 2% CREAM: 2 | 15 days supply | Qty: 60 | Fill #0

## 2017-07-17 NOTE — ED Triage Notes (Signed)
Pt here for groin itching and smell. sts that also he has been having some breast pain.

## 2017-07-17 NOTE — ED Provider Notes (Signed)
[facility]    CSN: [id number] Arrival date & time: 07/17/17  1055     History   Chief Complaint Chief Complaint  Patient presents with  . Penile Discharge    HPI Cody [name] is a 56 y.o. male.   This a 56 year old man who presents for evaluation of groin rash, itchy, and similar to past rash diagnosed as "jock itch".  He is morbidly obese and would like a reference to the [facility].  He suffers from plantar fasciitis.  He works as a Lawyer.      Past Medical History:  Diagnosis Date  . Hypertension   . Morbid obesity [facility])     Patient Active Problem List   Diagnosis Date Noted  . Lower extremity edema 03/30/2017  . Hematuria 03/30/2017  . Prediabetes 12/31/2016  . Dyspnea on exertion 12/31/2016  . Plantar fasciitis 01/01/2016  . Osteoarthritis of ankle and foot 01/01/2016  . Plantar fasciitis, bilateral 10/20/2015  . Morbid obesity (HCC) 10/20/2015  . Essential hypertension 10/20/2015    Past Surgical History:  Procedure Laterality Date  . ANKLE FUSION     left ankle 1980'S  . FOOT BONE EXCISION         Home Medications    Prior to Admission medications   Medication Sig Start Date End Date Taking? Authorizing Provider  furosemide (LASIX) 20 MG tablet Take 20 mg by mouth.    [provider]  ketoconazole (NIZORAL) 2 % cream Apply 1 application topically 2 (two) times daily. 07/17/17   Elvina Sidle, MD  meloxicam (MOBIC) 7.5 MG tablet Take 1 tablet (7.5 mg total) by mouth daily. 05/28/17   Massie Maroon, FNP  spironolactone (ALDACTONE) 25 MG tablet Take 1 tablet (25 mg total) by mouth daily. 04/21/17   Massie Maroon, FNP    Family History History reviewed. No pertinent family history.  Social History Social History  Substance Use Topics  . Smoking status: Former Games developer  . Smokeless tobacco: Never Used  . Alcohol use 0.0 oz/week     Comment: occ     Allergies   Patient has no known allergies.   Review of  Systems Review of Systems  Musculoskeletal: Positive for gait problem.  Skin: Positive for rash.  All other systems reviewed and are negative.    Physical Exam Triage Vital Signs ED Triage Vitals  Enc Vitals Group     BP      Pulse      Resp      Temp      Temp src      SpO2      Weight      Height      Head Circumference      Peak Flow      Pain Score      Pain Loc      Pain Edu?      Excl. in GC?    No data found.   Updated Vital Signs BP 133/90   Pulse 78   Temp 98.5 F (36.9 C)   Resp 18   SpO2 100%    Physical Exam  Constitutional: He is oriented to person, place, and time. He appears well-developed and well-nourished.  HENT:  Right Ear: External ear normal.  Left Ear: External ear normal.  Mouth/Throat: Oropharynx is clear and moist.  Eyes: Pupils are equal, round, and reactive to light. Conjunctivae are normal.  Neck: Normal range of motion. Neck supple.  Pulmonary/Chest: Effort normal.  Musculoskeletal: Normal range of motion.  Neurological: He is alert and oriented to person, place, and time.  Skin:  Hyperpigmented perineum and groin  Nursing note and vitals reviewed.    UC Treatments / Results  Labs (all labs ordered are listed, but only abnormal results are displayed) Labs Reviewed - No data to display  EKG  EKG Interpretation None       Radiology No results found.  Procedures Procedures (including critical care time)  Medications Ordered in UC Medications - No data to display   Initial Impression / Assessment and Plan / UC Course  I have reviewed the triage vital signs and the nursing notes.  Pertinent labs & imaging results that were available during my care of the patient were reviewed by me and considered in my medical decision making (see chart for details).     Final Clinical Impressions(s) / UC Diagnoses   Final diagnoses:  Tinea cruris    New Prescriptions New Prescriptions   KETOCONAZOLE (NIZORAL) 2 %  CREAM    Apply 1 application topically 2 (two) times daily.     Controlled Substance Prescriptions [place] Controlled Substance Registry consulted? Not Applicable   Elvina SidleLauenstein, Natalynn Pedone, MD 07/17/17 1227

## 2017-07-17 NOTE — Discharge Instructions (Signed)
I am [facility] for exercise and weight reduction.

## 2017-08-12 MED FILL — ?DICLOFENAC SOD DR 75 MG TA: 75 | 15 days supply | Qty: 30 | Fill #1

## 2017-09-03 ENCOUNTER — Telehealth: Payer: Self-pay

## 2017-09-03 DIAGNOSIS — I10 Essential (primary) hypertension: Secondary | ICD-10-CM

## 2017-09-03 MED ORDER — SPIRONOLACTONE 25 MG PO TABS: 25.0000 mg | ORAL_TABLET | Freq: Every day | ORAL | 1 refills | Status: DC

## 2017-09-03 NOTE — Telephone Encounter (Signed)
spironolactone sent into pharmacy. Thanks!

## 2017-09-04 ENCOUNTER — Ambulatory Visit: Payer: No Typology Code available for payment source | Admitting: Family Medicine

## 2017-09-15 ENCOUNTER — Telehealth: Payer: Self-pay

## 2017-09-15 ENCOUNTER — Ambulatory Visit: Payer: No Typology Code available for payment source | Admitting: Family Medicine

## 2017-09-15 NOTE — Telephone Encounter (Signed)
Called and spoke with patient and advised that he needs an appointment. Thanks!

## 2017-09-17 ENCOUNTER — Ambulatory Visit (INDEPENDENT_AMBULATORY_CARE_PROVIDER_SITE_OTHER): Payer: Self-pay | Admitting: Family Medicine

## 2017-09-17 ENCOUNTER — Encounter: Payer: Self-pay | Admitting: Family Medicine

## 2017-09-17 VITALS — BP 136/72 | HR 81 | Temp 98.3°F | Resp 16 | Ht 75.5 in | Wt >= 6400 oz

## 2017-09-17 DIAGNOSIS — R600 Localized edema: Secondary | ICD-10-CM

## 2017-09-17 DIAGNOSIS — M79672 Pain in left foot: Secondary | ICD-10-CM

## 2017-09-17 DIAGNOSIS — I10 Essential (primary) hypertension: Secondary | ICD-10-CM

## 2017-09-17 DIAGNOSIS — M792 Neuralgia and neuritis, unspecified: Secondary | ICD-10-CM

## 2017-09-17 LAB — POCT URINALYSIS DIP (DEVICE)
BILIRUBIN URINE: NEGATIVE
Glucose, UA: NEGATIVE mg/dL
KETONES UR: NEGATIVE mg/dL
Leukocytes, UA: NEGATIVE
Nitrite: NEGATIVE
PH: 6.5 (ref 5.0–8.0)
Protein, ur: NEGATIVE mg/dL
SPECIFIC GRAVITY, URINE: 1.015 (ref 1.005–1.030)
Urobilinogen, UA: 0.2 mg/dL (ref 0.0–1.0)

## 2017-09-17 LAB — BASIC METABOLIC PANEL WITH GFR
BUN: 11 mg/dL (ref 7–25)
CHLORIDE: 102 mmol/L (ref 98–110)
CO2: 26 mmol/L (ref 20–32)
Calcium: 9.4 mg/dL (ref 8.6–10.3)
Creat: 0.83 mg/dL (ref 0.70–1.33)
GFR, Est African American: 115 mL/min/{1.73_m2} (ref >=60)
GFR, Est Non African American: 99 mL/min/{1.73_m2} (ref >=60)
GLUCOSE: 97 mg/dL (ref 65–99)
Potassium: 4.1 mmol/L (ref 3.5–5.3)
SODIUM: 138 mmol/L (ref 135–146)

## 2017-09-17 LAB — POCT GLYCOSYLATED HEMOGLOBIN (HGB A1C): Hemoglobin A1C: 5.7

## 2017-09-17 MED ORDER — GABAPENTIN 300 MG PO CAPS: 300.0000 mg | ORAL_CAPSULE | Freq: Three times a day (TID) | ORAL | 3 refills | Status: DC

## 2017-09-17 MED ORDER — SPIRONOLACTONE 25 MG PO TABS: 25.0000 mg | ORAL_TABLET | Freq: Every day | ORAL | 5 refills | Status: DC

## 2017-09-17 MED FILL — ?SPIRONOLACTONE 25 MG TABLE: 25 | 30 days supply | Qty: 30 | Fill #0

## 2017-09-17 MED FILL — GABAPENTIN 300 MG CAPSULE: 300 | 30 days supply | Qty: 90 | Fill #0

## 2017-09-17 NOTE — Patient Instructions (Signed)
Will continue Spironolactone 25 mg daily for hypertension and bilateral lower extremity  Will start a trial of gabapentin 300 mg three times daily for left foot pain.    Gabapentin capsules or tablets What is this medicine? GABAPENTIN (GA ba pen tin) is used to control partial seizures in adults with epilepsy. It is also used to treat certain types of nerve pain. This medicine may be used for other purposes; ask your health care provider or pharmacist if you have questions. COMMON BRAND NAME(S): Active-PAC with Gabapentin, Gabarone, Neurontin What should I tell my health care provider before I take this medicine? They need to know if you have any of these conditions: -kidney disease -suicidal thoughts, plans, or attempt; a previous suicide attempt by you or a family member -an unusual or allergic reaction to gabapentin, other medicines, foods, dyes, or preservatives -pregnant or trying to get pregnant -breast-feeding How should I use this medicine? Take this medicine by mouth with a glass of water. Follow the directions on the prescription label. You can take it with or without food. If it upsets your stomach, take it with food.Take your medicine at regular intervals. Do not take it more often than directed. Do not stop taking except on your doctor's advice. If you are directed to break the 600 or 800 mg tablets in half as part of your dose, the extra half tablet should be used for the next dose. If you have not used the extra half tablet within 28 days, it should be thrown away. A special MedGuide will be given to you by the pharmacist with each prescription and refill. Be sure to read this information carefully each time. Talk to your pediatrician regarding the use of this medicine in children. Special care may be needed. Overdosage: If you think you have taken too much of this medicine contact a poison control center or emergency room at once. NOTE: This medicine is only for you. Do not  share this medicine with others. What if I miss a dose? If you miss a dose, take it as soon as you can. If it is almost time for your next dose, take only that dose. Do not take double or extra doses. What may interact with this medicine? Do not take this medicine with any of the following medications: -other gabapentin products This medicine may also interact with the following medications: -alcohol -antacids -antihistamines for allergy, cough and cold -certain medicines for anxiety or sleep -certain medicines for depression or psychotic disturbances -homatropine; hydrocodone -naproxen -narcotic medicines (opiates) for pain -phenothiazines like chlorpromazine, mesoridazine, prochlorperazine, thioridazine This list may not describe all possible interactions. Give your health care provider a list of all the medicines, herbs, non-prescription drugs, or dietary supplements you use. Also tell them if you smoke, drink alcohol, or use illegal drugs. Some items may interact with your medicine. What should I watch for while using this medicine? Visit your doctor or health care professional for regular checks on your progress. You may want to keep a record at home of how you feel your condition is responding to treatment. You may want to share this information with your doctor or health care professional at each visit. You should contact your doctor or health care professional if your seizures get worse or if you have any new types of seizures. Do not stop taking this medicine or any of your seizure medicines unless instructed by your doctor or health care professional. Stopping your medicine suddenly can increase your seizures or  their severity. Wear a medical identification bracelet or chain if you are taking this medicine for seizures, and carry a card that lists all your medications. You may get drowsy, dizzy, or have blurred vision. Do not drive, use machinery, or do anything that needs mental  alertness until you know how this medicine affects you. To reduce dizzy or fainting spells, do not sit or stand up quickly, especially if you are an older patient. Alcohol can increase drowsiness and dizziness. Avoid alcoholic drinks. Your mouth may get dry. Chewing sugarless gum or sucking hard candy, and drinking plenty of water will help. The use of this medicine may increase the chance of suicidal thoughts or actions. Pay special attention to how you are responding while on this medicine. Any worsening of mood, or thoughts of suicide or dying should be reported to your health care professional right away. Women who become pregnant while using this medicine may enroll in the [place] Antiepileptic Drug Pregnancy Registry by calling [phone]. This registry collects information about the safety of antiepileptic drug use during pregnancy. What side effects may I notice from receiving this medicine? Side effects that you should report to your doctor or health care professional as soon as possible: -allergic reactions like skin rash, itching or hives, swelling of the face, lips, or tongue -worsening of mood, thoughts or actions of suicide or dying Side effects that usually do not require medical attention (report to your doctor or health care professional if they continue or are bothersome): -constipation -difficulty walking or controlling muscle movements -dizziness -nausea -slurred speech -tiredness -tremors -weight gain This list may not describe all possible side effects. Call your doctor for medical advice about side effects. You may report side effects to FDA at 1-800-FDA-1088. Where should I keep my medicine? Keep out of reach of children. This medicine may cause accidental overdose and death if it taken by other adults, children, or pets. Mix any unused medicine with a substance like cat litter or coffee grounds. Then throw the medicine away in a sealed container like a sealed  bag or a coffee can with a lid. Do not use the medicine after the expiration date. Store at room temperature between 15 and 30 degrees C (59 and 86 degrees F). NOTE: This sheet is a summary. It may not cover all possible information. If you have questions about this medicine, talk to your doctor, pharmacist, or health care provider.  2018 Elsevier/Gold Standard (2013-12-17 15:26:50)

## 2017-09-17 NOTE — Progress Notes (Signed)
Subjective:    Patient ID: Cody [name], male    DOB: 04/04/1961, 56 y.o.   MRN: [id number]  HPI  Jeanett SchleinCharles Sande,rs a 56 year old male with a history of morbid obesity, lower extremity edema, and hypertension presents for follow-up.  Patient states that he is out of all prescribed medications.  He has been out of medications for greater than a week.  He does not follow a low-sodium, low-fat diet and does not exercise routinely.  Patient also does not check blood pressures at home.  He endorses bilateral lower extremity edema.  He denies current chest pains, heart palpitations, syncope, dizziness, or fatigue. Patient is also complaining of left foot pain.  Pain is described as burning and shooting and mostly occurs intermittently.  He states that pain is worsened by prolonged standing, pivoting, and bearing full weight.  Patient denies any recent injuries.  Current pain intensity is 3 out of 10.  He was previously prescribed meloxicam for left foot pain, but discontinued use due to GI issues. Past Medical History:  Diagnosis Date  . Hypertension   . Morbid obesity (HCC)    Review of Systems  Constitutional: Positive for unexpected weight change (Weight gain). Negative for fatigue and fever.  HENT: Negative.   Respiratory: Negative.   Cardiovascular: Negative.  Negative for chest pain and leg swelling.  Gastrointestinal: Negative.   Endocrine: Negative.  Negative for polydipsia, polyphagia and polyuria.  Genitourinary: Negative.   Musculoskeletal: Positive for arthralgias and myalgias.       Left foot pain  Allergic/Immunologic: Negative.   Neurological: Positive for numbness (Lower extremities).  Hematological: Negative.   Psychiatric/Behavioral: Negative.        Objective:   Physical Exam  Constitutional: He appears well-developed.  HENT:  Head: Normocephalic and atraumatic.  Right Ear: External ear normal.  Left Ear: External ear normal.  Mouth/Throat: Oropharynx is clear  and moist.  Eyes: Pupils are equal, round, and reactive to light.  Neck: Normal range of motion. Neck supple.  Cardiovascular: Normal rate, regular rhythm, normal heart sounds and intact distal pulses.  2+ pitting edema to ankles bilaterally  Pulmonary/Chest: Effort normal and breath sounds normal.  Abdominal: Soft. Bowel sounds are normal.  Increased abdominal girth  Musculoskeletal:       Left ankle: He exhibits decreased range of motion and swelling.       BP 136/72 (BP Location: Right Arm, Patient Position: Sitting, Cuff Size: Large) Comment: manually  Pulse 81   Temp 98.3 F (36.8 C) (Oral)   Resp 16   Ht 6' 3.5" (1.918 m)   Wt (!) 532 lb (241.3 kg)   SpO2 99%   BMI 65.62 kg/m  Assessment & Plan:  1. Essential hypertension Blood pressure is at goal on current medication regimen, no changes warranted.  Will review renal functioning as results become available - spironolactone (ALDACTONE) 25 MG tablet; Take 1 tablet (25 mg total) daily by mouth.  Dispense: 30 tablet; Refill: 5 - BASIC METABOLIC PANEL WITH GFR - HgB [id number]  2. Neuropathic pain Will start a trial of gabapentin 300 mg 3 times daily - gabapentin (NEURONTIN) 300 MG capsule; Take 1 capsule (300 mg total) 3 (three) times daily by mouth.  Dispense: 90 capsule; Refill: 3  3. Left foot pain Refer to #2  4. Lower extremity edema Will continue Spironolactone 25 mg daily.  Recommend compression stockings. - Compression stockings   RTC: 3 months for hypertension   Nolon NationsLaChina Moore Hollis  MSN, FNP-C  Patient [facility] [address] [place], [place] [address] [phone]

## 2017-10-23 ENCOUNTER — Encounter (HOSPITAL_COMMUNITY): Payer: Self-pay | Admitting: Emergency Medicine

## 2017-10-23 ENCOUNTER — Ambulatory Visit (HOSPITAL_COMMUNITY)
Admission: EM | Admit: 2017-10-23 | Discharge: 2017-10-23 | Disposition: A | Discharge status: Home / Self Care | Payer: Self-pay | Attending: Family Medicine | Admitting: Family Medicine

## 2017-10-23 DIAGNOSIS — M10072 Idiopathic gout, left ankle and foot: Secondary | ICD-10-CM

## 2017-10-23 MED ORDER — INDOMETHACIN 50 MG PO CAPS: 50.0000 mg | ORAL_CAPSULE | Freq: Two times a day (BID) | ORAL | 0 refills | Status: DC

## 2017-10-23 MED ORDER — PREDNISONE 20 MG PO TABS: ORAL_TABLET | ORAL | 0 refills | Status: DC

## 2017-10-23 MED FILL — INDOMETHACIN 50 MG CAPSULE: 50 | 10 days supply | Qty: 20 | Fill #0

## 2017-10-23 MED FILL — predniSONE 20 MG TABS: 20 | 5 days supply | Qty: 10 | Fill #0

## 2017-10-23 NOTE — Discharge Instructions (Signed)
Call [facility] in [place] for compression stockings: [phone]  To lose weight, eliminate carbohydrates as much as possible:  bread,  pasta, rice, potatos  Indocin with Maalox and follow with food twice a day until the toe pain goes away. If the Indocin upsets her stomach, you'll have to take prednisone every other day for several days.

## 2017-10-23 NOTE — ED Triage Notes (Signed)
PT C/O: BLE pain .... Reports hx of plantar fascitis .... Pain increases w/activity   ONSET:  2 weeks  DENIES: inj/trauma   TAKING MEDS: Diclofenac   A&O x4... NAD... Ambulatory

## 2017-10-23 NOTE — ED Provider Notes (Signed)
[facility]   [id number] 10/23/17 Arrival Time: 1104   SUBJECTIVE:  Cody [name] is a 56 y.o. male who presents to the urgent care with complaint of left foot pain.  This was evaluated several weeks ago.    The left  take thefoot is tender primarily in the great toe MTP joint, which also hurts to flex.  Note from 09/17/17 Patient is also complaining of left foot pain.  Pain is described as burning and shooting and mostly occurs intermittently.  He states that pain is worsened by prolonged standing, pivoting, and bearing full weight.  Patient denies any recent injuries.  Current pain intensity is 3 out of 10.  He was previously prescribed meloxicam for left foot pain, but discontinued use due to GI issues.     Past Medical History:  Diagnosis Date  . Hypertension   . Morbid obesity (HCC)    History reviewed. No pertinent family history. Social History   Socioeconomic History  . Marital status: Single    Spouse name: Not on file  . Number of children: Not on file  . Years of education: Not on file  . Highest education level: Not on file  Social Needs  . Financial resource strain: Not on file  . Food insecurity - worry: Not on file  . Food insecurity - inability: Not on file  . Transportation needs - medical: Not on file  . Transportation needs - non-medical: Not on file  Occupational History  . Not on file  Tobacco Use  . Smoking status: Former Games developermoker  . Smokeless tobacco: Never Used  Substance and Sexual Activity  . Alcohol use: Yes    Alcohol/week: 0.0 oz    Comment: occ  . Drug use: No  . Sexual activity: Not on file  Other Topics Concern  . Not on file  Social History Narrative  . Not on file   No outpatient medications have been marked as taking for the 10/23/17 encounter [facility](Hospital Encounter).   No Known Allergies    ROS: As per HPI, remainder of ROS negative.   OBJECTIVE:   Vitals:   10/23/17 1126  BP: (!) 163/105  Pulse: 91    Resp: 20  Temp: 98.2 F (36.8 C)  TempSrc: Oral  SpO2: 95%     General appearance: alert; no distress; morbidly obese Eyes: PERRL; EOMI; conjunctiva normal HENT: normocephalic; atraumatic; TMs normal, canal normal, external ears normal without trauma; nasal mucosa normal; oral mucosa normal Neck: supple Back: no CVA tenderness Extremities: no cyanosis but marked bilateral and symmetrical edema;  no gross deformities; very tender MTP joint.  Minimal plantar tenderness, mild edema Skin: warm and dry Neurologic: normal gait; grossly normal Psychological: alert and cooperative; normal mood and affect   35 minutes spent face to face with patient reviewing his diet and giving him information on exercise and compression hose.   Labs:  Results for orders placed or performed in visit on 09/17/17  BASIC METABOLIC PANEL WITH GFR  Result Value Ref Range   Glucose, Bld 97 65 - 99 mg/dL   BUN 11 7 - 25 mg/dL   Creat 1.190.83 1.470.70 - 8.291.33 mg/dL   GFR, Est Non African American 99 > OR = 60 mL/min/1.4073m2   GFR, Est African American 115 > OR = 60 mL/min/1.2673m2   BUN/Creatinine Ratio NOT APPLICABLE 6 - 22 (calc)   Sodium 138 135 - 146 mmol/L   Potassium 4.1 3.5 - 5.3 mmol/L   Chloride 102 98 -  110 mmol/L   CO2 26 20 - 32 mmol/L   Calcium 9.4 8.6 - 10.3 mg/dL  HgB [id number]  Result Value Ref Range   Hemoglobin A1C 5.7   POCT urinalysis dip (device)  Result Value Ref Range   Glucose, UA NEGATIVE NEGATIVE mg/dL   Bilirubin Urine NEGATIVE NEGATIVE   Ketones, ur NEGATIVE NEGATIVE mg/dL   Specific Gravity, Urine 1.015 1.005 - 1.030   Hgb urine dipstick TRACE (A) NEGATIVE   pH 6.5 5.0 - 8.0   Protein, ur NEGATIVE NEGATIVE mg/dL   Urobilinogen, UA 0.2 0.0 - 1.0 mg/dL   Nitrite NEGATIVE NEGATIVE   Leukocytes, UA NEGATIVE NEGATIVE    Labs Reviewed - No data to display  No results found.     ASSESSMENT & PLAN:  1. Acute idiopathic gout of left foot     Meds ordered this encounter   Medications  . predniSONE (DELTASONE) 20 MG tablet    Sig: Two daily with food    Dispense:  10 tablet    Refill:  0  . indomethacin (INDOCIN) 50 MG capsule    Sig: Take 1 capsule (50 mg total) by mouth 2 (two) times daily with a meal.    Dispense:  20 capsule    Refill:  0    Reviewed expectations re: course of current medical issues. Questions answered. Outlined signs and symptoms indicating need for more acute intervention. Patient verbalized understanding. After Visit Summary given.   Compression hose [employer] membership stressing swimming    Elvina SidleLauenstein, Lamia Mariner, MD 10/23/17 1228

## 2017-11-02 IMAGING — DX DG CHEST 2V
3 series · 3 of 3 positions shown · non-contrast
Comparison: 01/02/2006

CLINICAL DATA: Sick for 3 days, cough, hoarseness, fever, chest
congestion

EXAM:
CHEST  2 VIEW

[chest pa]
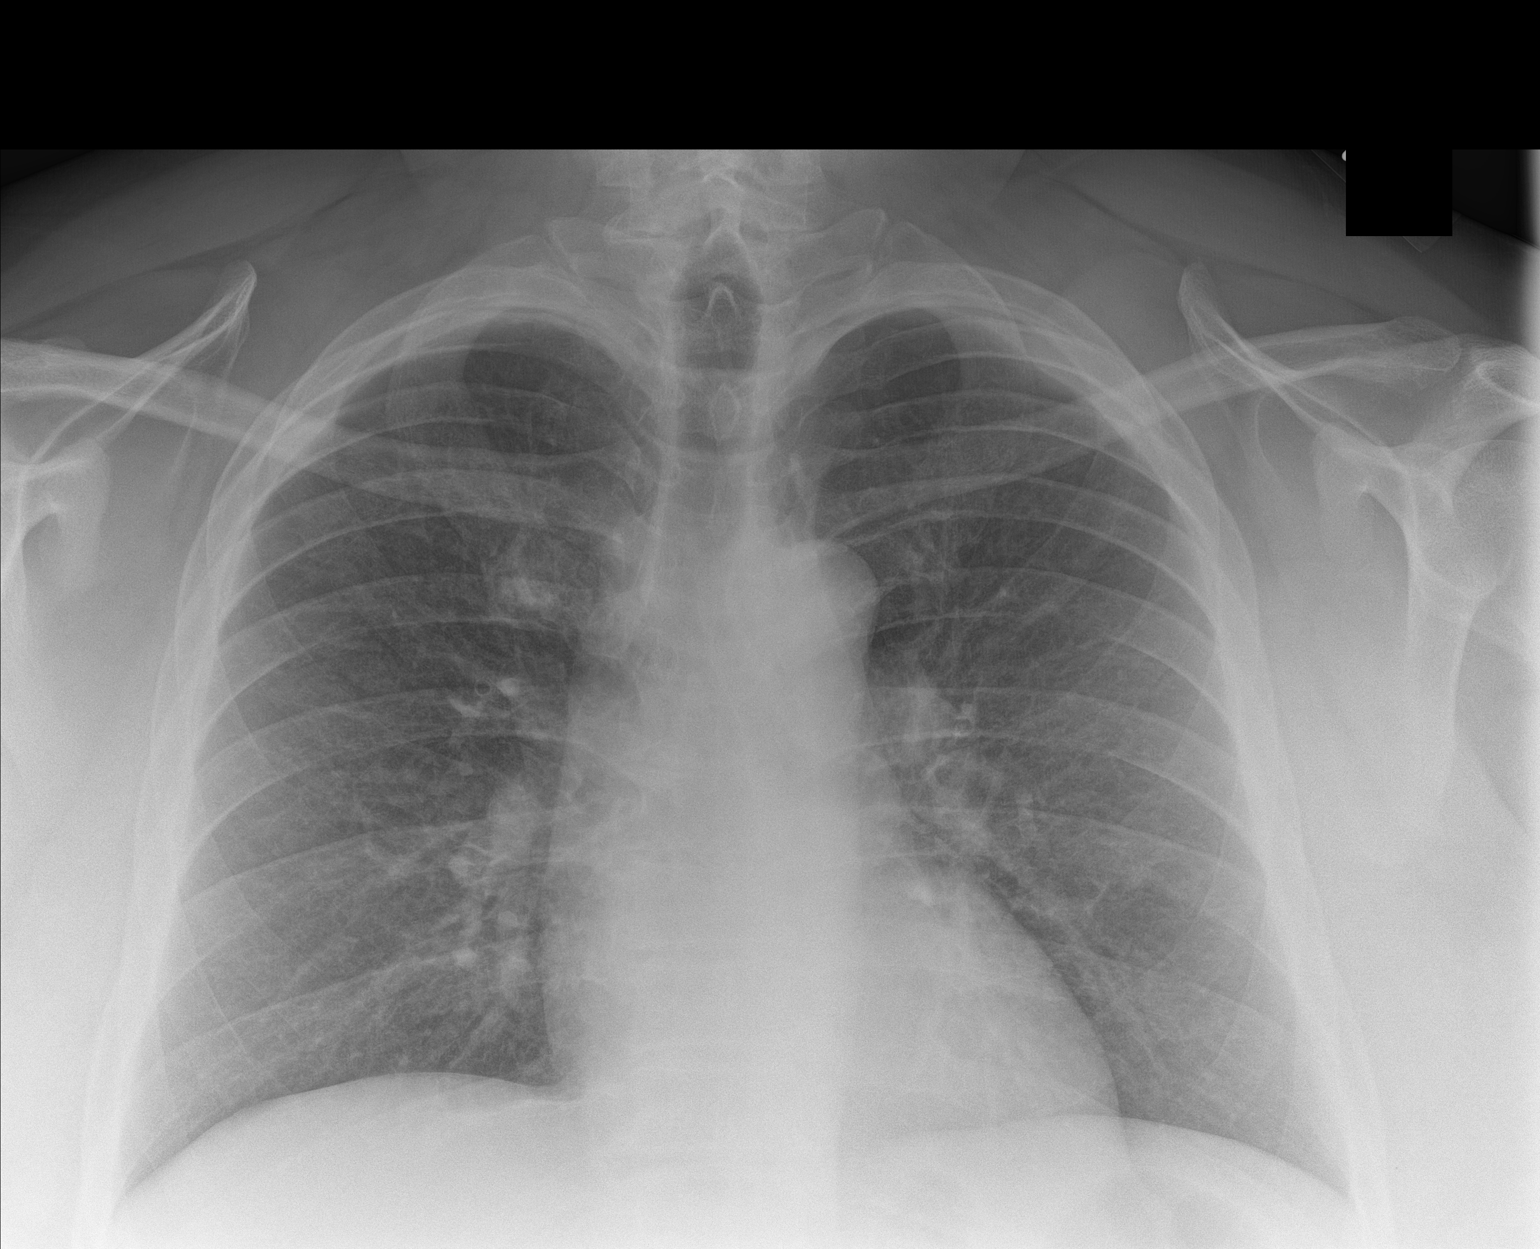

[chest lat (1 of 2)]
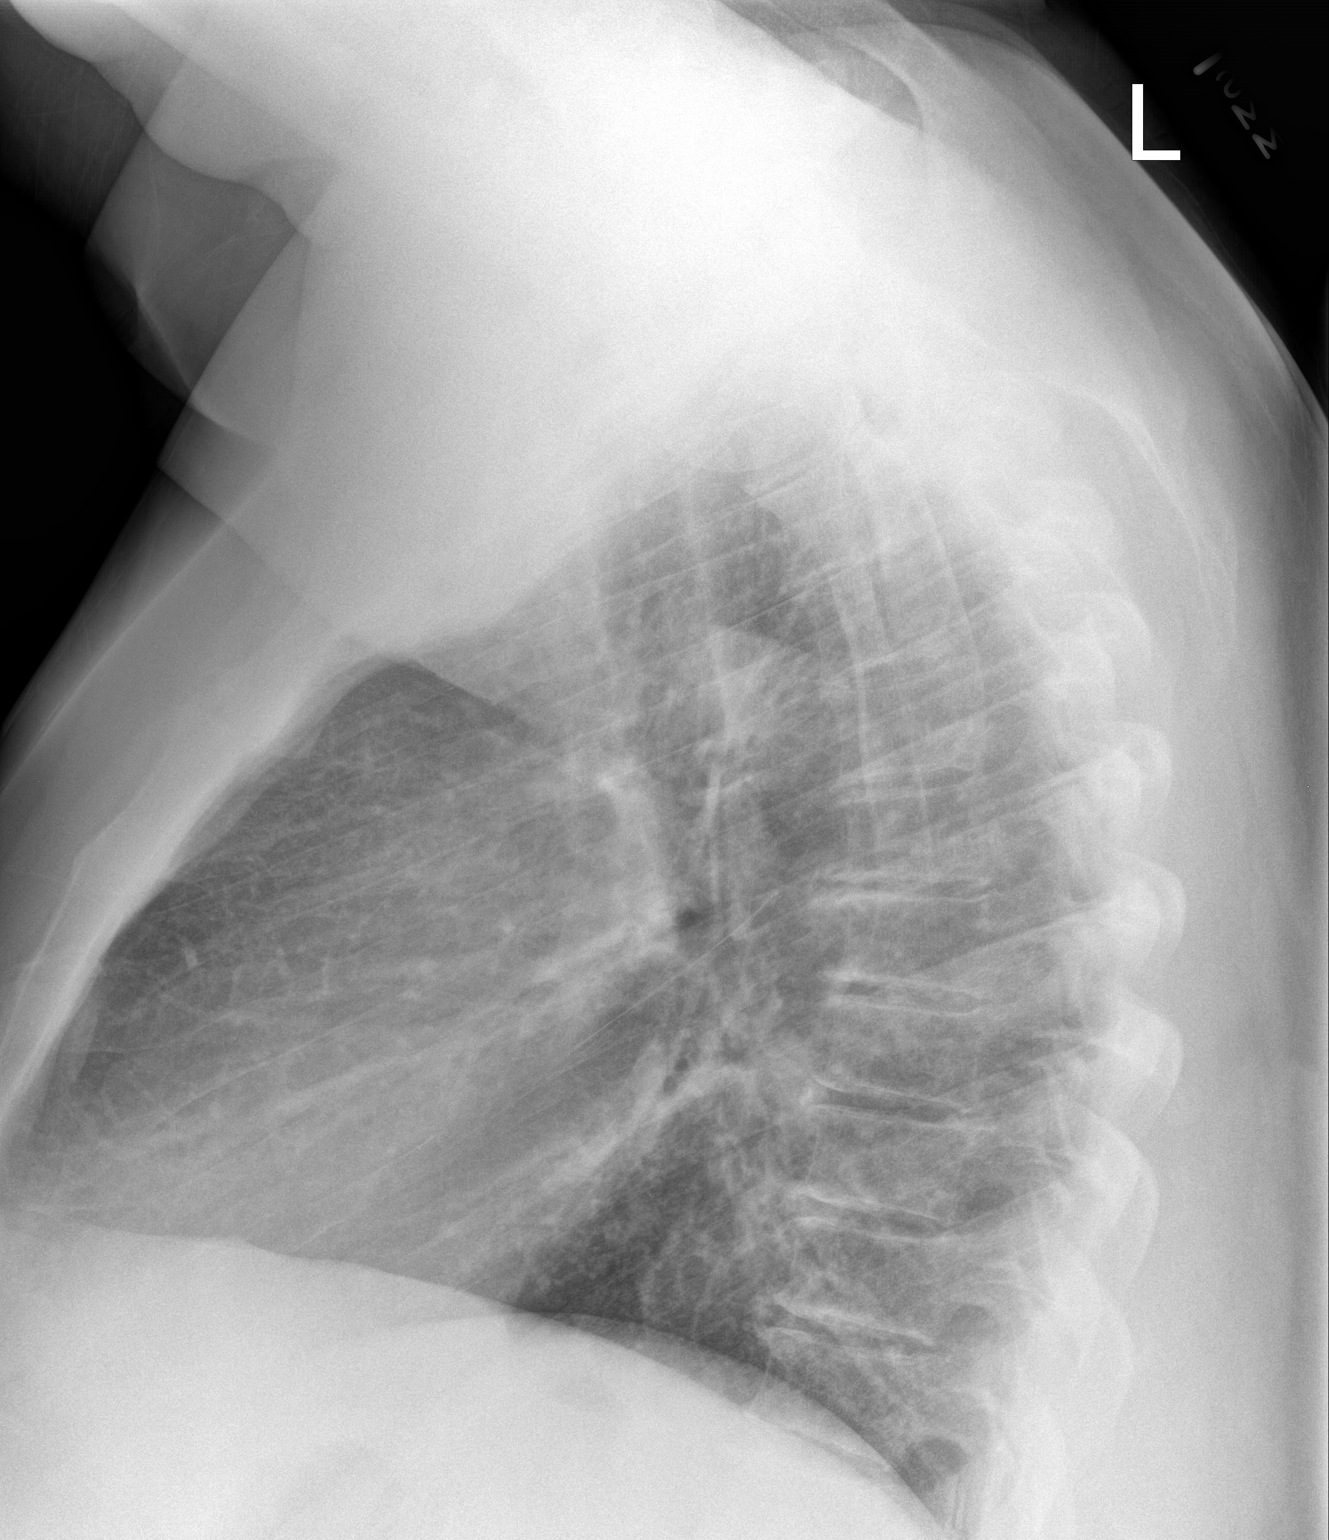

[chest lat (2 of 2)]
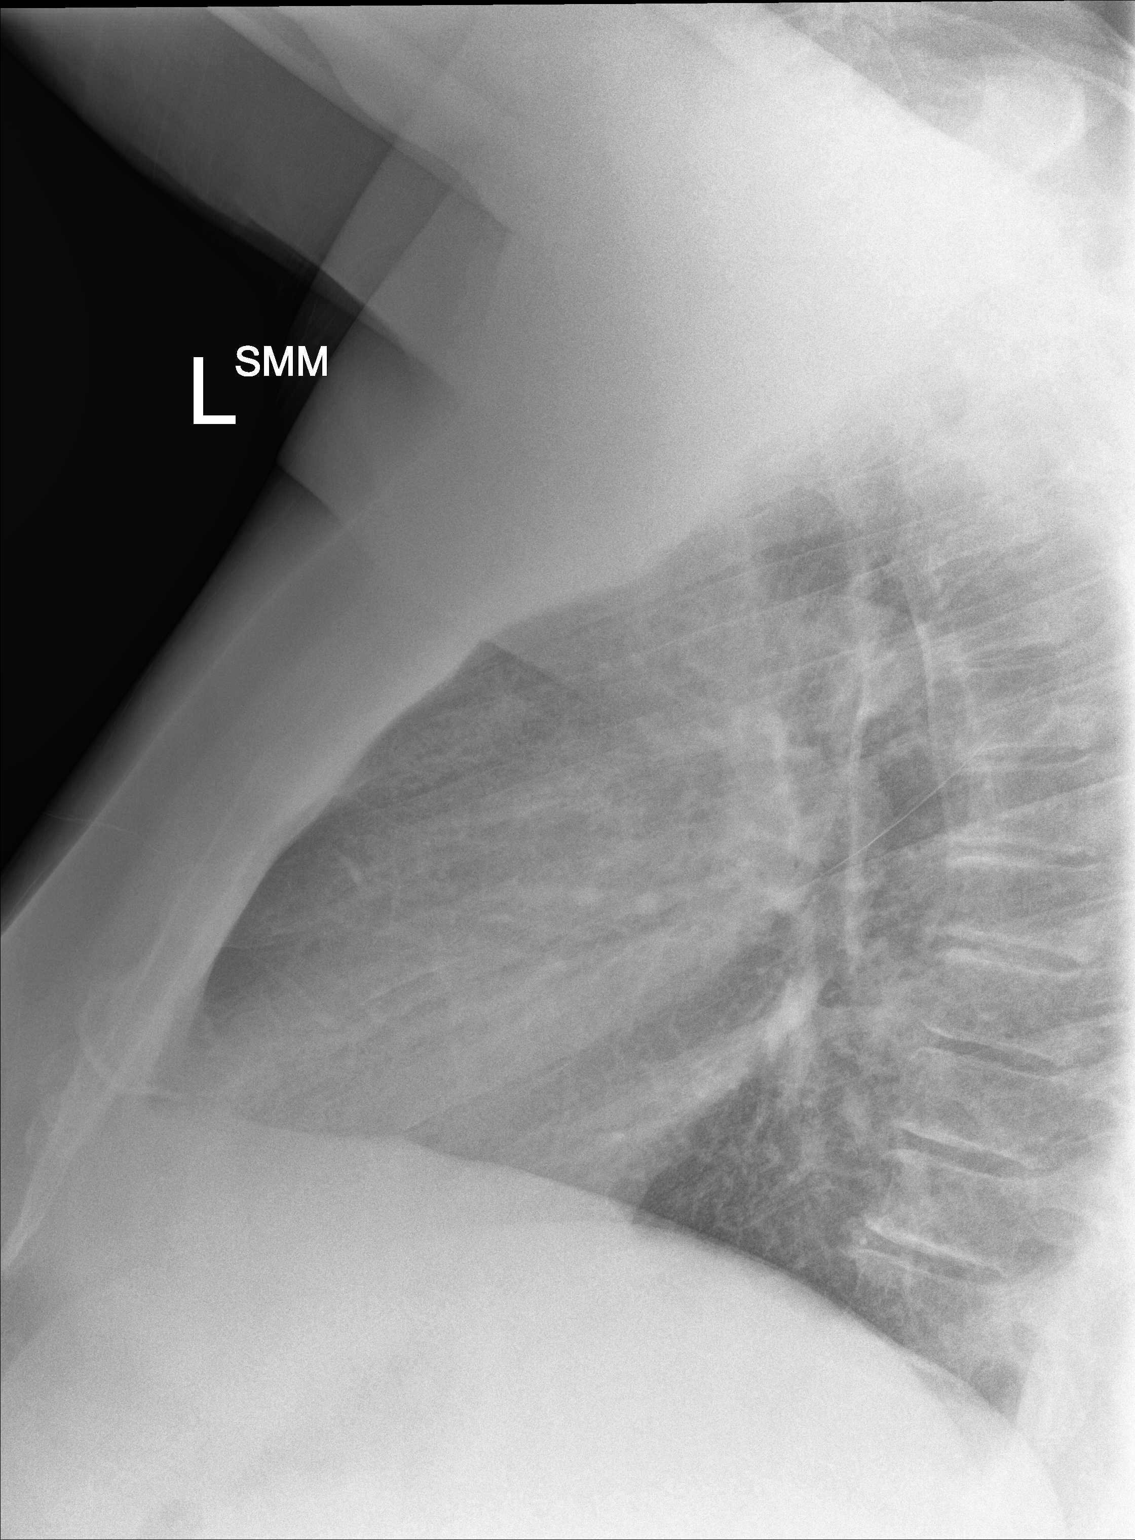

[3 of 3 positions shown; findings below may reference images not displayed]

FINDINGS: Cardiomediastinal silhouette is unremarkable. No acute infiltrate or
pleural effusion. No pulmonary edema. Mild degenerative changes mid
thoracic spine.
IMPRESSION: No active cardiopulmonary disease.

## 2017-11-10 ENCOUNTER — Ambulatory Visit: Payer: No Typology Code available for payment source | Admitting: Family Medicine

## 2017-11-14 MED FILL — SPIRONOLACTONE 25 MG TABS: 25 | 30 days supply | Qty: 30 | Fill #1

## 2017-11-17 ENCOUNTER — Ambulatory Visit (INDEPENDENT_AMBULATORY_CARE_PROVIDER_SITE_OTHER): Payer: No Typology Code available for payment source | Admitting: Family Medicine

## 2017-11-17 ENCOUNTER — Encounter: Payer: Self-pay | Admitting: Family Medicine

## 2017-11-17 DIAGNOSIS — L309 Dermatitis, unspecified: Secondary | ICD-10-CM

## 2017-11-17 DIAGNOSIS — K5900 Constipation, unspecified: Secondary | ICD-10-CM

## 2017-11-17 DIAGNOSIS — M19079 Primary osteoarthritis, unspecified ankle and foot: Secondary | ICD-10-CM

## 2017-11-17 MED ORDER — KETOROLAC TROMETHAMINE 60 MG/2ML IM SOLN
60.0000 mg | Freq: Once | INTRAMUSCULAR | Status: AC
  Administered 2017-11-17: 60 mg via INTRAMUSCULAR

## 2017-11-17 MED ORDER — LACTULOSE 10 GM/15ML PO SOLN: 10.0000 g | Freq: Three times a day (TID) | ORAL | 0 refills | Status: DC

## 2017-11-17 MED ORDER — TRIAMCINOLONE ACETONIDE 0.1 % EX CREA: 1.0000 "application " | TOPICAL_CREAM | Freq: Two times a day (BID) | CUTANEOUS | 1 refills | Status: DC

## 2017-11-17 MED FILL — TRIAMCINOLONE ACETONIDE 0.1: 0.1 | 30 days supply | Qty: 454 | Fill #0

## 2017-11-17 MED FILL — LACTULOSE Solution 10g/15ml: 10 | 5 days supply | Qty: 240 | Fill #0

## 2017-11-17 NOTE — Progress Notes (Signed)
Subjective:    Patient ID: Cody [name], male    DOB: [birth year], 57 y.o.   MRN: [id number]  Foot Pain  Associated symptoms include arthralgias, myalgias and numbness (Lower extremities). Pertinent negatives include no chest pain, fatigue or fever.    Cody [name],  a 57 year old male with a history of morbid obesity, lower extremity edema, and hypertension presents for follow-up.  P He does not follow a low-sodium, low-fat diet and does not exercise routinely.  Patient also does not check blood pressures at home.  He endorses bilateral lower extremity edema.  He denies current chest pains, heart palpitations, syncope, dizziness, or fatigue.  Patient continues to complain left foot pain.  Pain is described as burning and shooting and mostly occurs intermittently.  He states that pain is worsened by prolonged standing, pivoting, and bearing full weight.  Patient denies any recent injuries.  Current pain intensity is 4 out of 10.  He was previously prescribed meloxicam for left foot pain, but discontinued use due to GI issues. Past Medical History:  Diagnosis Date  . Hypertension   . Morbid obesity (HCC)    Immunization History  Administered Date(s) Administered  . Tdap 08/13/2016   Social History   Socioeconomic History  . Marital status: Single    Spouse name: Not on file  . Number of children: Not on file  . Years of education: Not on file  . Highest education level: Not on file  Social Needs  . Financial resource strain: Not on file  . Food insecurity - worry: Not on file  . Food insecurity - inability: Not on file  . Transportation needs - medical: Not on file  . Transportation needs - non-medical: Not on file  Occupational History  . Not on file  Tobacco Use  . Smoking status: Former Games developer  . Smokeless tobacco: Never Used  Substance and Sexual Activity  . Alcohol use: Yes    Alcohol/week: 0.0 oz    Comment: occ  . Drug use: No  . Sexual activity: Not on file   Other Topics Concern  . Not on file  Social History Narrative  . Not on file   Review of Systems  Constitutional: Positive for unexpected weight change (Weight gain). Negative for fatigue and fever.  HENT: Negative.   Respiratory: Negative.   Cardiovascular: Negative.  Negative for chest pain and leg swelling.  Gastrointestinal: Positive for constipation.  Endocrine: Negative.  Negative for polydipsia, polyphagia and polyuria.  Genitourinary: Negative.   Musculoskeletal: Positive for arthralgias and myalgias.       Left foot pain  Allergic/Immunologic: Negative.   Neurological: Positive for numbness (Lower extremities).  Hematological: Negative.   Psychiatric/Behavioral: Negative.        Objective:   Physical Exam  Constitutional: He appears well-developed.  HENT:  Head: Normocephalic and atraumatic.  Right Ear: External ear normal.  Left Ear: External ear normal.  Mouth/Throat: Oropharynx is clear and moist.  Eyes: Pupils are equal, round, and reactive to light.  Neck: Normal range of motion. Neck supple.  Cardiovascular: Normal rate, regular rhythm, normal heart sounds and intact distal pulses.  2+ pitting edema to ankles bilaterally  Pulmonary/Chest: Effort normal and breath sounds normal.  Abdominal: Soft. Bowel sounds are normal.  Increased abdominal girth  Musculoskeletal:       Left ankle: He exhibits decreased range of motion and swelling.       BP 128/75 (BP Location: Left Arm, Patient Position: Sitting, Cuff Size: Large)  Pulse 93   Temp 97.9 F (36.6 C) (Oral)   Resp 18   Ht 6' 3.5" (1.918 m)   Wt (!) 537 lb (243.6 kg)   SpO2 97%   BMI 66.23 kg/m  Assessment & Plan:  Morbid obesity (HCC) Body mass index is 66.23 kg/m. Recommend a lowfat, low carbohydrate diet divided over 5-6 small meals, increase water intake to 6-8 glasses, and 150 minutes per week of cardiovascular exercise.   Osteoarthritis of ankle and foot, unspecified laterality Morbid  obesity increases the stress on your ankles bilaterally, which can cause chronic pain and lead to other ankle-related complications, such as arthritis or osteoarthritis. Regular exercise can lessen and alleviate overweight and obesity-related ankle pain, stiffness, and swelling - ketorolac (TORADOL) injection 60 mg - Ambulatory referral to Sports Medicine  Constipation, unspecified constipation type - lactulose (CHRONULAC) 10 GM/15ML solution; Take 15 mLs (10 g total) by mouth 3 (three) times daily.  Dispense: 240 mL; Refill: 0   Eczema, unspecified type - triamcinolone cream (KENALOG) 0.1 %; Apply 1 application topically 2 (two) times daily.  Dispense: 453.6 g; Refill: 1   RTC: 6 months for chronic conditions   Nolon NationsLachina Moore Darleny Sem  MSN, FNP-C Patient Care [facility] [address]  [place], [place] [address] [phone]

## 2017-11-17 NOTE — Patient Instructions (Addendum)
Lactulose 3 times daily as needed for slow transit constipation We will send triamcinolone to your pharmacy apply twice daily to clean skin.   I have sent a referral to sports medicine, they will contact you with an appointment time.   Recommend low carbohydrate diet (ketogenic), and example of carbohydrate modify diet is as follows:  Breakfast: 2 eggs, 2 slices of Malaw[place]urkey bacon, cheese Snack: 16 almonds (TUNA, cottage cheese, slice [place]urkey, slice of cheese) Lunch: Small salad with full fat dressing and lean meat Snack: Refer to snack options Dinner: Low carbohydrate veggies, lean meat,

## 2017-11-19 ENCOUNTER — Ambulatory Visit: Payer: Self-pay

## 2017-11-21 ENCOUNTER — Ambulatory Visit
Admission: RE | Admit: 2017-11-21 | Discharge: 2017-11-21 | Disposition: A | Discharge status: Home / Self Care | Payer: Self-pay | Source: Ambulatory Visit | Attending: Family Medicine | Admitting: Family Medicine

## 2017-11-21 ENCOUNTER — Encounter: Payer: Self-pay | Admitting: Family Medicine

## 2017-11-21 ENCOUNTER — Ambulatory Visit (INDEPENDENT_AMBULATORY_CARE_PROVIDER_SITE_OTHER): Payer: Self-pay | Admitting: Family Medicine

## 2017-11-21 VITALS — BP 144/86 | Ht 75.5 in | Wt >= 6400 oz

## 2017-11-21 DIAGNOSIS — M2140 Flat foot [pes planus] (acquired), unspecified foot: Secondary | ICD-10-CM | POA: Insufficient documentation

## 2017-11-21 DIAGNOSIS — M19072 Primary osteoarthritis, left ankle and foot: Secondary | ICD-10-CM

## 2017-11-21 MED ORDER — ACETAMINOPHEN 500 MG PO TABS: 1000.0000 mg | ORAL_TABLET | Freq: Three times a day (TID) | ORAL | 2 refills | Status: DC | PRN

## 2017-11-21 MED ORDER — MELOXICAM 15 MG PO TABS: 15.0000 mg | ORAL_TABLET | Freq: Every day | ORAL | 2 refills | Status: DC

## 2017-11-21 NOTE — Progress Notes (Signed)
HPI  CC: Chronic left ankle pain Patient is here today with complaints of left-sided ankle pain over the past 1-2 years.  He states that he has had gradually worsening ankle pain during this time.  He denies any specific injury, trauma, or event which may have caused this.  Pain is located "all over the foot" from the medial/lateral malleoli, across the talar dome and tarsometatarsal joints, as well as the MTPs.  He denies any weakness, numbness, or paresthesias.  Pain is described as achy occasionally sharp and constant.  Worse with persistent ambulation and weightbearing.  He denies any feelings of instability.  Occasional swelling.  Traumatic: No  Location: Left foot/ankle, as stated above  Quality: Achy, constant, occasional sharpness  Duration: 1-2 years Timing: Prolonged ambulation  Improving/Worsening: Worsening Makes better: rest  Makes worse: ambulation  Associated symptoms: severe weight gain   Previous Interventions Tried: none   Past Injuries: prior left ankle injury in football (decades ago)  Past Surgeries: right ankle hardware placement  Smoking: none  Family Hx: noncontributory   ROS: Per HPI; in addition no fever, no rash, no additional weakness, no additional numbness, no additional paresthesias, and no additional falls/injury.   Objective: BP (!) 144/86   Ht 6' 3.5" (1.918 m)   Wt (!) 475 lb (215.5 kg)   BMI 58.59 kg/m  Gen: NAD, well groomed, a/o x3, normal affect.  CV: Well-perfused. Warm.  Resp: Non-labored.  Neuro: Sensation intact throughout. No gross coordination deficits.  Gait: Nonpathologic posture, unremarkable stride without signs of limp or balance issues. Ankle/Foot, Left: TTP noted at the medial malleolus, lateral mall bilateral tarsometatarsal joints, distal metatarsals. No visible erythema, swelling, or ecchymosis. Notable pes planus deformity. Transverse arch grossly intact; No evidence of tibiotalar deviation; Range of motion is full in all  directions. Strength is 5/5 in all directions. No tenderness at the insertion/body/myotendinous junction of the Achilles tendon; No peroneal tendon tenderness or subluxation; Stable lateral and medial ligaments; Unremarkable calcaneal squeeze; No plantar calcaneal tenderness;  Able to walk 4 steps.    Assessment and Plan:  Acquired pes planus Patient is here with complaints of foot and ankle pain consistent with suspected osteoarthritis of the ankle and foot as well as evidence on physical exam consistent with acquired pes planus. -Green shoe inserts with scaphoid pad provided today. -Discussed importance of weight loss >> provided information on low impact exercises: Stationary bike, swimming, elliptical. -Meloxicam once daily; encourage Tylenol for breakthrough pain -Obtain foot and ankle x-rays.  Will review results at follow-up appointment. -Follow-up 3 weeks.   Orders Placed This Encounter  Procedures  . DG Foot 2 Views Left    Standing Status:   Future    Standing Expiration Date:   01/20/2019    Order Specific Question:   Reason for Exam (SYMPTOM  OR DIAGNOSIS REQUIRED)    Answer:   left foot/ankle pain    Order Specific Question:   Preferred imaging location?    Answer:   [facility]    Order Specific Question:   Radiology Contrast Protocol - do NOT remove file path    Answer:   \\[email or web address]s.pdf  . DG Ankle 2 Views Left    Standing Status:   Future    Standing Expiration Date:   05/21/2018    Order Specific Question:   Reason for Exam (SYMPTOM  OR DIAGNOSIS REQUIRED)    Answer:   left foot/ankle pain    Order Specific Question:   Preferred imaging  location?    Answer:   [facility]    Order Specific Question:   Radiology Contrast Protocol - do NOT remove file path    Answer:   \\[email or web address]s.pdf    Meds ordered this encounter  Medications  . meloxicam (MOBIC) 15 MG  tablet    Sig: Take 1 tablet (15 mg total) by mouth daily.    Dispense:  30 tablet    Refill:  2  . acetaminophen (TYLENOL) 500 MG tablet    Sig: Take 2 tablets (1,000 mg total) by mouth every 8 (eight) hours as needed.    Dispense:  180 tablet    Refill:  2     Kathee DeltonIan D McKeag, MD,MS [facility] Sports Medicine Fellow 11/21/2017 12:39 PM

## 2017-11-21 NOTE — Assessment & Plan Note (Addendum)
Patient is here with complaints of foot and ankle pain consistent with suspected osteoarthritis of the ankle and foot as well as evidence on physical exam consistent with acquired pes planus. -Green shoe inserts with scaphoid pad provided today. -Discussed importance of weight loss >> provided information on low impact exercises: Stationary bike, swimming, elliptical. -Meloxicam once daily; encourage Tylenol for breakthrough pain -Obtain foot and ankle x-rays.  Will review results at follow-up appointment. -Follow-up 3 weeks.

## 2017-11-23 NOTE — Progress Notes (Signed)
SM[facility]: Attending Note: I have reviewed the chart, discussed wit the Sports Medicine Fellow. I agree with assessment and treatment plan as detailed in the Fellow's note.  

## 2017-11-28 ENCOUNTER — Ambulatory Visit (INDEPENDENT_AMBULATORY_CARE_PROVIDER_SITE_OTHER): Payer: Self-pay | Admitting: Family Medicine

## 2017-11-28 ENCOUNTER — Encounter: Payer: Self-pay | Admitting: Family Medicine

## 2017-11-28 VITALS — BP 154/99 | Ht 75.5 in | Wt >= 6400 oz

## 2017-11-28 DIAGNOSIS — M19072 Primary osteoarthritis, left ankle and foot: Secondary | ICD-10-CM

## 2017-11-28 MED FILL — MELOXICAM 15 MG TABLET: 15 | 30 days supply | Qty: 30 | Fill #0

## 2017-11-28 NOTE — Progress Notes (Signed)
Chief complaint: Follow up of left ankle pain times 5 months  History of present illness: Cody [name] is a 57 year old male who presents to sports medicine office today for follow-up of left ankle pain.  He presented for initial evaluation for this 1 week ago back on 11/21/17 and saw Dr. Wende MottMcKeag and Dr. Jennette KettleNeal for this.  In brief review, he reports of having left medial ankle pain as well as lateral ankle pain for the last 5 months.  He reports in the distant past he had a left distal tib-fib fracture repaired by ORIF with rod and 7 screw placements back in 1980 after a football injury.  Reports back in August that he was going down a step from the house out to his truck and he took a misstep and had an inversion injury involving his left ankle.  He reports since that time he has had left ankle pain.  Before that time he had not had any issues with the foot or ankle on the left side.  He reports pain has been progressively worsening to involve his entire left ankle.  He describes the pain as an aching, occasionally sharp pain.  He reports that any type of prolonged standing, walking, and left foot inversion are aggravating factors. He reports rest and being off his foot and ankle are only alleviating factors.  Today, he rates the pain as a 5/10.  He does not report of any numbness, tingling, or burning paresthesias.  He does not report of any warmth, erythema, or ecchymosis.  No report of any effusion.  He was here last week, it was found that he did have bilateral pes planus.  Green insoles and scaphoid padding were applied and he reports of having a lot of improvement with that in regards to his flat feet, but still has the medial and lateral ankle pain.  He reports that he has not picked up the meloxicam that was prescribed for him at last week.  He reports that he is planning to get that today.  Also, x-rays were done of his left foot and ankle and he is here today to review those x-ray results.  Review of  systems:  As stated above  Interval past medical history, surgical history, family history, and social history obtained and unchanged.  Physical exam: Vital signs are reviewed and are documented in the chart Gen.: Alert, oriented, appears stated age, in no apparent distress HEENT: Moist oral mucosa Respiratory: Normal respirations, able to speak in full sentences Cardiac: Regular rate, distal pulses 2+ Integumentary: No rashes on visible skin:  Neurologic: Strength is 4+/5 with left ankle inversion, otherwise strength 5/5 in bilateral feet and ankles, sensation 2+ in bilateral feet and ankles Psych: Normal affect, mood is described as good Musculoskeletal: Inspection of left foot reveals no obvious deformity or muscle atrophy, no warmth, erythema, ecchymosis, or effusion noted, he is tender palpation over the posterior medial malleolus as well as the posterior tibialis tendon, he is also tender to palpation over the anteromedial talus as well as lateral malleolus, does have pain with dorsiflexion and inversion, no pain elicited with plantar flexion or eversion, he does have slightly limited range of motion with dorsiflexion and plantar flexion, no tenderness to palpation over the Lisfranc, navicular, or base of fifth metatarsal, no tenderness over the calcaneus, anterior drawer, talar tilt, and calcaneal squeeze test negative, he does have noticeable pes planus bilaterally  Assessment and plan: 1. Left ankle post-traumatic osteoarthritis, s/p ORIF for left distal tib-fib  fracture in 1980 2. Bilateral pes planus 3. 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 Morbid obesity  Plan: I did personally review his x-rays that were done on 11/21/17.  In regards to his left ankle, do see rod and screw fixation of the distal fibula, with the fifth screw being fractured, do not see any acute bony abnormality otherwise. He does have a good amount of arthritis along the anterior talus as well as moderate  arthritis involving both the medial and lateral gutters; I do not see any type of avulsion.  In regards to his left foot, he does have moderate amount of arthritis at the first MTP joint, fortunately this is not symptomatic today.  Will have him continue with the green insoles with scaphoid padding.  Discussed at some point in time he may need custom orthotics.  I did discuss with him that his pain is multi-facotrial. He does have a moderate amount of posttraumatic arthritis and probably it would be a good idea for him to at least have a one-time visit with an orthopedic foot and ankle specialist to talk about it when would be the time for ankle fusion.  I do not believe the fractured screw is causing any symptoms for him. I don't think he needs to rush off and have ankle fusion now, but I do want to plant this seed in his head. Cortisone shots could be an option in the future, but before we go down this route I would least have him speak with one of the orthopedic surgeons here.  We did have him set up to see Dr. Aldean Baker with [facility]  He will follow-up here in a few weeks for reevaluation, with discussion of making custom orthotics at that time.  Otherwise, he will follow-up sooner as needed.  Will also to have him continue the meloxicam.   Haynes Kerns, M.D. Primary Care Sports Medicine Fellow [facility] Sports Medicine

## 2017-12-10 ENCOUNTER — Ambulatory Visit: Payer: Self-pay | Admitting: Family Medicine

## 2017-12-12 ENCOUNTER — Ambulatory Visit: Payer: Self-pay | Admitting: Family Medicine

## 2017-12-15 ENCOUNTER — Encounter (INDEPENDENT_AMBULATORY_CARE_PROVIDER_SITE_OTHER): Payer: Self-pay | Admitting: Orthopedic Surgery

## 2017-12-15 ENCOUNTER — Ambulatory Visit (INDEPENDENT_AMBULATORY_CARE_PROVIDER_SITE_OTHER): Payer: No Typology Code available for payment source | Admitting: Orthopedic Surgery

## 2017-12-15 DIAGNOSIS — M12572 Traumatic arthropathy, left ankle and foot: Secondary | ICD-10-CM

## 2017-12-15 DIAGNOSIS — M6702 Short Achilles tendon (acquired), left ankle: Secondary | ICD-10-CM | POA: Insufficient documentation

## 2017-12-15 DIAGNOSIS — M2022 Hallux rigidus, left foot: Secondary | ICD-10-CM | POA: Insufficient documentation

## 2017-12-15 MED ORDER — METHYLPREDNISOLONE ACETATE 40 MG/ML IJ SUSP
40.0000 mg | INTRAMUSCULAR | Status: AC | PRN
  Administered 2017-12-15: 40 mg via INTRA_ARTICULAR

## 2017-12-15 MED ORDER — LIDOCAINE HCL 1 % IJ SOLN
2.0000 mL | INTRAMUSCULAR | Status: AC | PRN
  Administered 2017-12-15: 2 mL

## 2017-12-15 NOTE — Progress Notes (Signed)
Office Visit Note   Patient: Cody [name]           Date of Birth: 10/02/1961           MRN: [id number] Visit Date: 12/15/2017              Requested by: [name], [name], FNP [address] [place], [place] [address] PCP: [name], [name], FNP  Chief Complaint  Patient presents with  . Left Ankle - Pain      HPI: Patient is a 57 year old gentleman who presents complaining of a 56-month history of pain in his left ankle as well as a painful great toe MTP joint on the left.  Past medical history positive for ankle fracture in 1980 for which she underwent open reduction internal fixation.  Patient states his ankle pain is a 7 out of 10.  Patient has been using Mobic.  He states he has had some blood in his urine from anti-inflammatories in the past.  Assessment & Plan: Visit Diagnoses: No diagnosis found.  Plan: Left ankle was injected patient was given instructions for a stiff soled Trail running sneaker such as Hoka or new balance.  Patient was given instructions for Achilles stretching.  Discussed that he may require arthroscopic debridement of the left ankle and possible cheilectomy of the great toe MTP joint versus fusion.  2 view radiographs of the left ankle shows a congruent mortise there is a syndesmotic screw that has broken no other complicating features.  Follow-Up Instructions: Return in about 3 weeks (around 01/05/2018).   Ortho Exam  Patient is alert, oriented, no adenopathy, well-dressed, normal affect, normal respiratory effort. Examination patient has a good dorsalis pedis pulse he does have heel cord contracture with dorsiflexion only to about neutral.  He is tender to palpation anteriorly over the ankle joint.  There is no redness no cellulitis there is no signs of gout or infection.  Patient has hallux rigidus with dorsiflexion only about 30 degrees of the great toe MTP joint.  He has bony spurs palpable dorsally.  There is no redness no cellulitis no  tophaceous changes.  Imaging: No results found.   Labs: Lab Results  Component Value Date   HGBA1C 5.7 09/17/2017   HGBA1C 5.5 12/31/2016   HGBA1C 5.9 (H) 02/12/2016   LABORGA NO GROWTH 03/26/2017    @LABSALLVALUES (HGBA1)@  There is no height or weight on file to calculate BMI.  Orders:  No orders of the defined types were placed in this encounter.  No orders of the defined types were placed in this encounter.    Procedures: Medium Joint Inj: L ankle on 12/15/2017 11:47 AM Indications: pain and diagnostic evaluation Details: 22 G 1.5 in needle, anteromedial approach Medications: 2 mL lidocaine 1 %; 40 mg methylPREDNISolone acetate 40 MG/ML Outcome: tolerated well, no immediate complications Procedure, treatment alternatives, risks and benefits explained, specific risks discussed. Consent was given by the patient. Immediately prior to procedure a time out was called to verify the correct patient, procedure, equipment, support staff and site/side marked as required. Patient was prepped and draped in the usual sterile fashion.      Clinical Data: No additional findings.  ROS:  All other systems negative, except as noted in the HPI. Review of Systems  Objective: Vital Signs: There were no vitals taken for this visit.  Specialty Comments:  No specialty comments available.  PMFS History: Patient Active Problem List   Diagnosis Date Noted  . Acquired  pes planus 11/21/2017  . Lower extremity edema 03/30/2017  . Hematuria 03/30/2017  . Prediabetes 12/31/2016  . Dyspnea on exertion 12/31/2016  . Plantar fasciitis 01/01/2016  . Osteoarthritis of ankle and foot 01/01/2016  . Plantar fasciitis, bilateral 10/20/2015  . Morbid obesity (HCC) 10/20/2015  . Essential hypertension 10/20/2015   Past Medical History:  Diagnosis Date  . Hypertension   . Morbid obesity (HCC)     History reviewed. No pertinent family history.  Past Surgical History:  Procedure Laterality  Date  . ANKLE FUSION     left ankle 1980'S  . FOOT BONE EXCISION     Social History   Occupational History  . Not on file  Tobacco Use  . Smoking status: Former Games developer  . Smokeless tobacco: Never Used  Substance and Sexual Activity  . Alcohol use: Yes    Alcohol/week: 0.0 oz    Comment: occ  . Drug use: No  . Sexual activity: Not on file

## 2018-01-05 ENCOUNTER — Ambulatory Visit (INDEPENDENT_AMBULATORY_CARE_PROVIDER_SITE_OTHER): Payer: Self-pay | Admitting: Orthopedic Surgery

## 2018-01-05 ENCOUNTER — Encounter (INDEPENDENT_AMBULATORY_CARE_PROVIDER_SITE_OTHER): Payer: Self-pay | Admitting: Orthopedic Surgery

## 2018-01-05 VITALS — Ht 75.0 in | Wt >= 6400 oz

## 2018-01-05 DIAGNOSIS — M12572 Traumatic arthropathy, left ankle and foot: Secondary | ICD-10-CM

## 2018-01-05 DIAGNOSIS — M6702 Short Achilles tendon (acquired), left ankle: Secondary | ICD-10-CM

## 2018-01-05 DIAGNOSIS — M76822 Posterior tibial tendinitis, left leg: Secondary | ICD-10-CM | POA: Insufficient documentation

## 2018-01-05 MED ORDER — MELOXICAM 15 MG PO TABS: 15.0000 mg | ORAL_TABLET | Freq: Every day | ORAL | 2 refills | Status: DC

## 2018-01-05 MED FILL — MELOXICAM 15 MG TABLET: 15 | 30 days supply | Qty: 30 | Fill #0

## 2018-01-05 NOTE — Addendum Note (Signed)
Addended by: Aldean BakerUDA, MARCUS on: 01/05/2018 09:51 AM   Modules accepted: Orders

## 2018-01-05 NOTE — Progress Notes (Signed)
Office Visit Note   Patient: Cody [name]           Date of Birth: 1961-09-20           MRN: [id number] Visit Date: 01/05/2018              Requested by: Massie Maroon, FNP [address] [place], [place] [address] PCP: Massie Maroon, FNP  Chief Complaint  Patient presents with  . Left Ankle - Pain    S/p injection 12/15/17      HPI: Patient is a 57 year old gentleman who presents in follow-up for left lower extremity pain.  Patient states that the ankle injection did help but he states he is having pain now along the posterior tibial tendon where it inserts into the navicular.  Pain radiates up the posterior tibial tendon.  Assessment & Plan: Visit Diagnoses:  1. Traumatic arthritis of left ankle   2. Achilles tendon contracture, left   3. Posterior tibial tendinitis, left leg     Plan: Arch supports were placed in both shoes to unload pressure from the posterior tibial tendon we will remove his nonsteroidal.  Plan to follow-up in 2 months.  Patient's symptoms seem to be primarily from posterior tibial tendon insufficiency at this time.  Follow-Up Instructions: Return in about 2 months (around 03/07/2018).   Ortho Exam  Patient is alert, oriented, no adenopathy, well-dressed, normal affect, normal respiratory effort. Examination patient has a palpable dorsalis pedis pulse he has dorsiflexion to neutral.  He has pronation and valgus with pes planus with posterior tibial tendon insufficiency.  Patient is point tender to palpation along the posterior tibial tendon.  He cannot do a single limb heel raise.  Imaging: No results found. No images are attached to the encounter.  Labs: Lab Results  Component Value Date   HGBA1C 5.7 09/17/2017   HGBA1C 5.5 12/31/2016   HGBA1C 5.9 (H) 02/12/2016   LABORGA NO GROWTH 03/26/2017    @LABSALLVALUES (HGBA1)@  Body mass index is 58.12 kg/m.  Orders:  No orders of the defined types were placed in this  encounter.  No orders of the defined types were placed in this encounter.    Procedures: No procedures performed  Clinical Data: No additional findings.  ROS:  All other systems negative, except as noted in the HPI. Review of Systems  Objective: Vital Signs: Ht 6\' 3"  (1.905 m)   Wt (!) 465 lb (210.9 kg)   BMI 58.12 kg/m   Specialty Comments:  No specialty comments available.  PMFS History: Patient Active Problem List   Diagnosis Date Noted  . Posterior tibial tendinitis, left leg 01/05/2018  . Traumatic arthritis of left ankle 12/15/2017  . Hallux rigidus, left foot 12/15/2017  . Achilles tendon contracture, left 12/15/2017  . Acquired pes planus 11/21/2017  . Lower extremity edema 03/30/2017  . Hematuria 03/30/2017  . Prediabetes 12/31/2016  . Dyspnea on exertion 12/31/2016  . Plantar fasciitis 01/01/2016  . Osteoarthritis of ankle and foot 01/01/2016  . Plantar fasciitis, bilateral 10/20/2015  . Morbid obesity (HCC) 10/20/2015  . Essential hypertension 10/20/2015   Past Medical History:  Diagnosis Date  . Hypertension   . Morbid obesity (HCC)     History reviewed. No pertinent family history.  Past Surgical History:  Procedure Laterality Date  . ANKLE FUSION     left ankle 1980'S  . FOOT BONE EXCISION     Social History   Occupational History  . Not on  file  Tobacco Use  . Smoking status: Former Games developermoker  . Smokeless tobacco: Never Used  Substance and Sexual Activity  . Alcohol use: Yes    Alcohol/week: 0.0 oz    Comment: occ  . Drug use: No  . Sexual activity: Not on file

## 2018-01-19 ENCOUNTER — Ambulatory Visit (INDEPENDENT_AMBULATORY_CARE_PROVIDER_SITE_OTHER): Payer: Self-pay

## 2018-01-19 ENCOUNTER — Other Ambulatory Visit: Payer: Self-pay

## 2018-01-19 ENCOUNTER — Encounter (HOSPITAL_COMMUNITY): Payer: Self-pay | Admitting: Emergency Medicine

## 2018-01-19 ENCOUNTER — Ambulatory Visit (HOSPITAL_COMMUNITY)
Admission: EM | Admit: 2018-01-19 | Discharge: 2018-01-19 | Disposition: A | Discharge status: Home / Self Care | Payer: Self-pay | Attending: Internal Medicine | Admitting: Internal Medicine

## 2018-01-19 DIAGNOSIS — Y92009 Unspecified place in unspecified non-institutional (private) residence as the place of occurrence of the external cause: Secondary | ICD-10-CM

## 2018-01-19 DIAGNOSIS — S20212A Contusion of left front wall of thorax, initial encounter: Secondary | ICD-10-CM

## 2018-01-19 DIAGNOSIS — W19XXXA Unspecified fall, initial encounter: Secondary | ICD-10-CM

## 2018-01-19 DIAGNOSIS — R8290 Unspecified abnormal findings in urine: Secondary | ICD-10-CM

## 2018-01-19 DIAGNOSIS — R0781 Pleurodynia: Secondary | ICD-10-CM

## 2018-01-19 LAB — POCT URINALYSIS DIP (DEVICE)
BILIRUBIN URINE: NEGATIVE
Glucose, UA: NEGATIVE mg/dL
Ketones, ur: NEGATIVE mg/dL
LEUKOCYTES UA: NEGATIVE
NITRITE: NEGATIVE
PH: 6 (ref 5.0–8.0)
Protein, ur: NEGATIVE mg/dL
Urobilinogen, UA: 0.2 mg/dL (ref 0.0–1.0)

## 2018-01-19 LAB — POCT I-STAT, CHEM 8
BUN: 16 mg/dL (ref 6–20)
CALCIUM ION: 1.14 mmol/L — AB (ref 1.15–1.40)
CHLORIDE: 103 mmol/L (ref 101–111)
CREATININE: 0.8 mg/dL (ref 0.61–1.24)
GLUCOSE: 103 mg/dL — AB (ref 65–99)
HCT: 43 % (ref 39.0–52.0)
Hemoglobin: 14.6 g/dL (ref 13.0–17.0)
POTASSIUM: 4 mmol/L (ref 3.5–5.1)
Sodium: 140 mmol/L (ref 135–145)
TCO2: 26 mmol/L (ref 22–32)

## 2018-01-19 MED ORDER — TRAMADOL HCL 50 MG PO TABS: 50.0000 mg | ORAL_TABLET | Freq: Four times a day (QID) | ORAL | 0 refills | Status: DC | PRN

## 2018-01-19 MED FILL — traMADol HCL 50 MG TABS: 50 | 3 days supply | Qty: 15 | Fill #0

## 2018-01-19 NOTE — ED Provider Notes (Signed)
[facility]    CSN: [id number] Arrival date & time: 01/19/18  1000     History   Chief Complaint Chief Complaint  Patient presents with  . Fall    HPI Cody [name] is a 57 y.o. male.   Cody [name] presents with complaints of left sided abdominal and rib pain after a fall in his bathroom 10 days ago. He slipped on the tile and struck his bathtub to his left upper abdomin and ribs. Did not hit head, did not lose consciousness. Denies any previous rib injury. He states he developed pain approximately 2 days after the injury and then stayed in bed for two days due to the pain. He has had mild improvement of symptoms. Short of breath. Feels spasms to the region. Poor sleep due to pain. Has been taking aleve which has not helped. Takes another antiinflammatory but does not know what it is called. History of htn and obesity, history of hematuria. States he is urinating dark brown since the fall which is new for him. Without nausea or vomiting but decreased appetite. No known fevers.    ROS per HPI.       Past Medical History:  Diagnosis Date  . Hypertension   . Morbid obesity [facility])     Patient Active Problem List   Diagnosis Date Noted  . Posterior tibial tendinitis, left leg 01/05/2018  . Traumatic arthritis of left ankle 12/15/2017  . Hallux rigidus, left foot 12/15/2017  . Achilles tendon contracture, left 12/15/2017  . Acquired pes planus 11/21/2017  . Lower extremity edema 03/30/2017  . Hematuria 03/30/2017  . Prediabetes 12/31/2016  . Dyspnea on exertion 12/31/2016  . Plantar fasciitis 01/01/2016  . Osteoarthritis of ankle and foot 01/01/2016  . Plantar fasciitis, bilateral 10/20/2015  . Morbid obesity (HCC) 10/20/2015  . Essential hypertension 10/20/2015    Past Surgical History:  Procedure Laterality Date  . ANKLE FUSION     left ankle 1980'S  . FOOT BONE EXCISION         Home Medications    Prior to Admission medications   Medication Sig  Start Date End Date Taking? Authorizing Provider  spironolactone (ALDACTONE) 25 MG tablet Take 1 tablet (25 mg total) daily by mouth. 09/17/17  Yes Massie Maroon, FNP  traMADol (ULTRAM) 50 MG tablet Take 1 tablet (50 mg total) by mouth every 6 (six) hours as needed. 01/19/18   Georgetta Haber, NP    Family History History reviewed. No pertinent family history.  Social History Social History   Tobacco Use  . Smoking status: Former Games developer  . Smokeless tobacco: Never Used  Substance Use Topics  . Alcohol use: Yes    Alcohol/week: 0.0 oz    Comment: occ  . Drug use: No     Allergies   Patient has no known allergies.   Review of Systems Review of Systems   Physical Exam Triage Vital Signs ED Triage Vitals  Enc Vitals Group     BP 01/19/18 1030 (!) 164/91     Pulse Rate 01/19/18 1030 69     Resp 01/19/18 1030 18     Temp 01/19/18 1030 98.8 F (37.1 C)     Temp Source 01/19/18 1030 Oral     SpO2 01/19/18 1030 97 %     Weight --      Height --      Head Circumference --      Peak Flow --  Pain Score 01/19/18 1029 9     Pain Loc --      Pain Edu? --      Excl. in GC? --    No data found.  Updated Vital Signs BP (!) 164/91 (BP Location: Right Wrist)   Pulse 69   Temp 98.8 F (37.1 C) (Oral)   Resp 18   SpO2 97%   Visual Acuity Right Eye Distance:   Left Eye Distance:   Bilateral Distance:    Right Eye Near:   Left Eye Near:    Bilateral Near:     Physical Exam  Constitutional: He is oriented to person, place, and time. He appears well-developed and well-nourished.  Cardiovascular: Normal rate and regular rhythm.  Pulmonary/Chest: Effort normal. He has decreased breath sounds.  Generally decreased breath sounds throughout due to pain causing limited deep breathing as well as large body habitus  Abdominal: There is tenderness in the left upper quadrant.    Bruising to LUQ noted; tenderness to LUQ as well as to left distal anterior ribs     Neurological: He is alert and oriented to person, place, and time.  Skin: Skin is warm and dry.     UC Treatments / Results  Labs (all labs ordered are listed, but only abnormal results are displayed) Labs Reviewed  POCT URINALYSIS DIP (DEVICE) - Abnormal; Notable for the following components:      Result Value   Hgb urine dipstick SMALL (*)    All other components within normal limits  POCT I-STAT, CHEM 8 - Abnormal; Notable for the following components:   Glucose, Bld 103 (*)    Calcium, Ion 1.14 (*)    All other components within normal limits    EKG  EKG Interpretation None       Radiology Dg Ribs Unilateral W/chest Left  Result Date: 01/19/2018 CLINICAL DATA:  Status post fall 10 days ago EXAM: LEFT RIBS AND CHEST - 3+ VIEW COMPARISON:  01/10/2016 FINDINGS: No fracture or other bone lesions are seen involving the ribs. There is no evidence of pneumothorax or pleural effusion. Both lungs are clear. Heart size and mediastinal contours are within normal limits. IMPRESSION: Negative. Electronically Signed   By: Elige KoHetal  Patel   On: 01/19/2018 10:58    Procedures Procedures (including critical care time)  Medications Ordered in UC Medications - No data to display   Initial Impression / Assessment and Plan / UC Course  I have reviewed the triage vital signs and the nursing notes.  Pertinent labs & imaging results that were available during my care of the patient were reviewed by me and considered in my medical decision making (see chart for details).     Chem 8 reassuring for h&h at this time. Creatinine Wnl as well. Hematuria appears chronic per chart review. Urine is yellow in appearance at this time. Vitals stable. Ambulatory. Chest xray without acute findings. Consistent with contusion. NSAIDS and tramadol for pain control. Recommended close follow up with PCP for recheck. Return precautions provided. Patient verbalized understanding and agreeable to plan.  Ambulatory  out of clinic without difficulty.    Final Clinical Impressions(s) / UC Diagnoses   Final diagnoses:  Fall in home, initial encounter  Contusion of left front wall of thorax, initial encounter    ED Discharge Orders        Ordered    traMADol (ULTRAM) 50 MG tablet  Every 6 hours PRN     01/19/18 1143  Controlled Substance Prescriptions [place] Controlled Substance Registry consulted? Not Applicable   Georgetta Haber, NP 01/19/18 1147

## 2018-01-19 NOTE — ED Triage Notes (Signed)
[facility] a [facility] of left side rib pain secondary to a fall 10 days ago.

## 2018-01-19 NOTE — Discharge Instructions (Signed)
May continue with antiinflammatories for pain control. Tramadol every 6 hours as well as needed for breakthrough pain. Continue with activity as tolerated Force a cough and/or deep breath at least every hour to prevent pneumonia. Please make an appointment with your Primary Care provider for recheck of symptoms

## 2018-01-21 ENCOUNTER — Telehealth (INDEPENDENT_AMBULATORY_CARE_PROVIDER_SITE_OTHER): Payer: Self-pay | Admitting: Orthopedic Surgery

## 2018-01-21 NOTE — Telephone Encounter (Signed)
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

## 2018-01-21 NOTE — Telephone Encounter (Signed)
Patient called asking for a refill on his meloxicam and tramadol. [facility] CB # [phone]

## 2018-01-22 NOTE — Telephone Encounter (Signed)
Lites the patient in the office to evaluate the source of his symptoms.

## 2018-01-23 ENCOUNTER — Telehealth (INDEPENDENT_AMBULATORY_CARE_PROVIDER_SITE_OTHER): Payer: Self-pay | Admitting: Radiology

## 2018-01-23 MED FILL — ?SPIRONOLACTONE 25 MG TABLE: 25 | 30 days supply | Qty: 30 | Fill #2

## 2018-01-23 NOTE — Telephone Encounter (Signed)
I spoke with this pt and due to work he needs an early am appt. I told him to be here by 8am and we would work him in. Can you open a spot just to get him on the sch please?

## 2018-01-23 NOTE — Telephone Encounter (Signed)
Called and lm on vm to advise to call office for appt.

## 2018-01-23 NOTE — Telephone Encounter (Signed)
Patient left message requesting an emergent appointment with Dr. Lajoyce Cornersuda Monday 3/25, due to extreme left foot and ankle pain, patient was last seen on 3/4. Dr. Audrie Liauda's schedule is booked for all of next week, can this patient be worked in? Call back number [phone]

## 2018-01-26 ENCOUNTER — Ambulatory Visit (INDEPENDENT_AMBULATORY_CARE_PROVIDER_SITE_OTHER): Payer: Self-pay | Admitting: Orthopedic Surgery

## 2018-02-05 ENCOUNTER — Ambulatory Visit (INDEPENDENT_AMBULATORY_CARE_PROVIDER_SITE_OTHER): Payer: Self-pay | Admitting: Orthopedic Surgery

## 2018-02-05 ENCOUNTER — Encounter (INDEPENDENT_AMBULATORY_CARE_PROVIDER_SITE_OTHER): Payer: Self-pay | Admitting: Orthopedic Surgery

## 2018-02-05 VITALS — Ht 75.0 in | Wt >= 6400 oz

## 2018-02-05 DIAGNOSIS — M2022 Hallux rigidus, left foot: Secondary | ICD-10-CM

## 2018-02-05 DIAGNOSIS — M19172 Post-traumatic osteoarthritis, left ankle and foot: Secondary | ICD-10-CM

## 2018-02-05 DIAGNOSIS — M12572 Traumatic arthropathy, left ankle and foot: Secondary | ICD-10-CM

## 2018-02-05 MED ORDER — TRAMADOL HCL 50 MG PO TABS: 50.0000 mg | ORAL_TABLET | Freq: Four times a day (QID) | ORAL | 0 refills | Status: DC | PRN

## 2018-02-05 MED ORDER — METHYLPREDNISOLONE ACETATE 40 MG/ML IJ SUSP
40.0000 mg | INTRAMUSCULAR | Status: AC | PRN
  Administered 2018-02-05: 40 mg via INTRA_ARTICULAR

## 2018-02-05 MED ORDER — LIDOCAINE HCL 1 % IJ SOLN
2.0000 mL | INTRAMUSCULAR | Status: AC | PRN
  Administered 2018-02-05: 2 mL

## 2018-02-05 NOTE — Progress Notes (Signed)
Office Visit Note   Patient: Cody [name]           Date of Birth: Jul 26, 1961           MRN: [id number] Visit Date: 02/05/2018              Requested by: Massie Maroon, FNP [address] [place], [place] [address] PCP: Massie Maroon, FNP  Chief Complaint  Patient presents with  . Left Ankle - Pain      HPI: Patient is seen in follow-up for the left foot.  He is status post open reduction internal fixation for a ankle fracture and he is concerned that most of his pain is coming from the broken syndesmotic screw.  Patient is previously had pain over the Achilles tendon pain over the posterior tibial tendon as well as hallux rigidus pain.  Patient states his pain is primarily anteriorly over the ankle and across the midfoot.  Patient is in regular soft new balance sneakers does have custom orthotics.  Assessment & Plan: Visit Diagnoses:  1. Traumatic arthritis of left ankle   2. Hallux rigidus, left foot   3. Post-traumatic osteoarthritis, left ankle and foot     Plan: Patient's left ankle was injected.  He does have traumatic arthritis of the ankle joint and we will see if the injection helps.  Patient may benefit from arthroscopic debridement depending on the results from the injection.  Patient is now having pain primarily over the degenerative talonavicular joint.  Will reevaluate this joint at follow-up.  Discussed that we may need to get a CT scan of the foot to further evaluate the extent of the traumatic arthritis.  Follow-Up Instructions: Return in about 1 month (around 03/05/2018).   Ortho Exam  Patient is alert, oriented, no adenopathy, well-dressed, normal affect, normal respiratory effort. Examination patient has a good pulse he is tender to palpation anteriorly over the ankle.  He has dorsiflexion to neutral dorsiflexion reproduces his ankle pain.  Patient has pain across the midfoot pain across the talonavicular joint.  Review of the radiographs shows  complete collapse across the talonavicular joint.  Imaging: No results found.   Labs: Lab Results  Component Value Date   HGBA1C 5.7 09/17/2017   HGBA1C 5.5 12/31/2016   HGBA1C 5.9 (H) 02/12/2016   LABORGA NO GROWTH 03/26/2017    @LABSALLVALUES (HGBA1)@  Body mass index is 58.12 kg/m.  Orders:  Orders Placed This Encounter  Procedures  . Medium Joint Inj   Meds ordered this encounter  Medications  . traMADol (ULTRAM) 50 MG tablet    Sig: Take 1 tablet (50 mg total) by mouth every 6 (six) hours as needed.    Dispense:  15 tablet    Refill:  0     Procedures: Medium Joint Inj: L ankle on 02/05/2018 4:16 PM Indications: pain and diagnostic evaluation Details: 22 G 1.5 in needle, anteromedial approach Medications: 2 mL lidocaine 1 %; 40 mg methylPREDNISolone acetate 40 MG/ML Outcome: tolerated well, no immediate complications Procedure, treatment alternatives, risks and benefits explained, specific risks discussed. Consent was given by the patient. Immediately prior to procedure a time out was called to verify the correct patient, procedure, equipment, support staff and site/side marked as required. Patient was prepped and draped in the usual sterile fashion.      Clinical Data: No additional findings.  ROS:  All other systems negative, except as noted in the HPI. Review of Systems  Objective: Vital  Signs: Ht 6\' 3"  (1.905 m)   Wt (!) 465 lb (210.9 kg)   BMI 58.12 kg/m   Specialty Comments:  No specialty comments available.  PMFS History: Patient Active Problem List   Diagnosis Date Noted  . Posterior tibial tendinitis, left leg 01/05/2018  . Traumatic arthritis of left ankle 12/15/2017  . Hallux rigidus, left foot 12/15/2017  . Achilles tendon contracture, left 12/15/2017  . Acquired pes planus 11/21/2017  . Lower extremity edema 03/30/2017  . Hematuria 03/30/2017  . Prediabetes 12/31/2016  . Dyspnea on exertion 12/31/2016  . Plantar fasciitis  01/01/2016  . Osteoarthritis of ankle and foot 01/01/2016  . Plantar fasciitis, bilateral 10/20/2015  . Morbid obesity (HCC) 10/20/2015  . Essential hypertension 10/20/2015   Past Medical History:  Diagnosis Date  . Hypertension   . Morbid obesity (HCC)     History reviewed. No pertinent family history.  Past Surgical History:  Procedure Laterality Date  . ANKLE FUSION     left ankle 1980'S  . FOOT BONE EXCISION     Social History   Occupational History  . Not on file  Tobacco Use  . Smoking status: Former Games developermoker  . Smokeless tobacco: Never Used  Substance and Sexual Activity  . Alcohol use: Yes    Alcohol/week: 0.0 oz    Comment: occ  . Drug use: No  . Sexual activity: Not on file

## 2018-02-16 MED FILL — ?SPIRONOLACTONE 25 MG TABLE: 25 | 30 days supply | Qty: 30 | Fill #3

## 2018-02-16 MED FILL — MELOXICAM 15 MG TABLET: 15 | 30 days supply | Qty: 30 | Fill #1

## 2018-03-09 ENCOUNTER — Ambulatory Visit (INDEPENDENT_AMBULATORY_CARE_PROVIDER_SITE_OTHER): Payer: Self-pay | Admitting: Orthopedic Surgery

## 2018-03-09 ENCOUNTER — Encounter (INDEPENDENT_AMBULATORY_CARE_PROVIDER_SITE_OTHER): Payer: Self-pay | Admitting: Orthopedic Surgery

## 2018-03-09 VITALS — Ht 75.0 in | Wt >= 6400 oz

## 2018-03-09 DIAGNOSIS — M12572 Traumatic arthropathy, left ankle and foot: Secondary | ICD-10-CM

## 2018-03-09 DIAGNOSIS — M19172 Post-traumatic osteoarthritis, left ankle and foot: Secondary | ICD-10-CM

## 2018-03-09 NOTE — Progress Notes (Signed)
Office Visit Note   Patient: Cody [name]           Date of Birth: 1961-09-03           MRN: [id number] Visit Date: 03/09/2018              Requested by: Massie Maroon, FNP [address] [place], [place] [address] PCP: Massie Maroon, FNP  Chief Complaint  Patient presents with  . Left Ankle - Follow-up    02/05/18 left ankle injection       HPI: Patient is a 57 year old gentleman with traumatic arthritis of the left ankle status post injection of the ankle.  Patient states injection lasted about a week.  Patient also complains of pain at this time primarily across the talonavicular joint.  Assessment & Plan: Visit Diagnoses:  1. Traumatic arthritis of left ankle   2. Post-traumatic osteoarthritis, left ankle and foot     Plan: Patient states he would like to proceed with surgical intervention in a staged manner.  He states would like surgery in about 90 days.  He would like to proceed with ankle arthroscopy with out of work for 1 to 2 weeks.  Follow-up 2 weeks after surgery.  Patient states he is also having pain with retained hardware in the fibula.  Discussed that when he is ready we would need to proceed with talonavicular fusion excision of the bony spurs and we could remove the painful retained hardware at that time.  Discussed that he was need to be completely off his foot for 4 weeks.  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  Follow-Up Instructions: Return if symptoms worsen or fail to improve.   Ortho Exam  Patient is alert, oriented, no adenopathy, well-dressed, normal affect, normal respiratory effort. Examination patient has a palpable dorsalis pedis pulse.  He has limited subtalar motion and pain to palpation over the talonavicular joint.  Radiographs shows cystic changes with collapse of the joint with osteophytic bone spurs dorsally.  Patient is also tender to palpation over the ankle joint and does have decreased range of motion of the  ankle joint but not as much as the subtalar motion.  Patient has tenderness to palpation across the ankle joint as well.  Imaging: No results found. No images are attached to the encounter.  Labs: Lab Results  Component Value Date   HGBA1C 5.7 09/17/2017   HGBA1C 5.5 12/31/2016   HGBA1C 5.9 (H) 02/12/2016   LABORGA NO GROWTH 03/26/2017    Lab Results  Component Value Date/Time   HGBA1C 5.7 09/17/2017 03:51 PM   HGBA1C 5.5 12/31/2016 10:14 AM   HGBA1C 5.9 (H) 02/12/2016 10:03 AM    Body mass index is 58.12 kg/m.  Orders:  No orders of the defined types were placed in this encounter.  No orders of the defined types were placed in this encounter.    Procedures: No procedures performed  Clinical Data: No additional findings.  ROS:  All other systems negative, except as noted in the HPI. Review of Systems  Objective: Vital Signs: Ht  (1.905 m)   Wt (!) 465 lb (210.9 kg)   BMI 58.12 kg/m   Specialty Comments:  No specialty comments available.  PMFS History: Patient Active Problem List   Diagnosis Date Noted  . Posterior tibial tendinitis, left leg 01/05/2018  . Traumatic arthritis of left ankle 12/15/2017  . Hallux rigidus, left foot 12/15/2017  . Achilles tendon  contracture, left 12/15/2017  . Acquired pes planus 11/21/2017  . Lower extremity edema 03/30/2017  . Hematuria 03/30/2017  . Prediabetes 12/31/2016  . Dyspnea on exertion 12/31/2016  . Plantar fasciitis 01/01/2016  . Osteoarthritis of ankle and foot 01/01/2016  . Plantar fasciitis, bilateral 10/20/2015  . Morbid obesity (HCC) 10/20/2015  . Essential hypertension 10/20/2015   Past Medical History:  Diagnosis Date  . Hypertension   . Morbid obesity (HCC)     History reviewed. No pertinent family history.  Past Surgical History:  Procedure Laterality Date  . ANKLE FUSION     left ankle 1980'S  . FOOT BONE EXCISION     Social History   Occupational History  . Not on file    Tobacco Use  . Smoking status: Former Games developer  . Smokeless tobacco: Never Used  Substance and Sexual Activity  . Alcohol use: Yes    Alcohol/week: 0.0 oz    Comment: occ  . Drug use: No  . Sexual activity: Not on file

## 2018-03-31 MED FILL — ?SPIRONOLACTONE 25 MG TABLE: 25 | 30 days supply | Qty: 30 | Fill #4

## 2018-03-31 MED FILL — MELOXICAM 15 MG TABLET: 15 | 30 days supply | Qty: 30 | Fill #2

## 2018-04-06 ENCOUNTER — Ambulatory Visit: Payer: Self-pay | Attending: Internal Medicine

## 2018-04-14 ENCOUNTER — Ambulatory Visit (HOSPITAL_COMMUNITY)
Admission: EM | Admit: 2018-04-14 | Discharge: 2018-04-14 | Disposition: A | Discharge status: Home / Self Care | Payer: Self-pay | Attending: Family Medicine | Admitting: Family Medicine

## 2018-04-14 ENCOUNTER — Other Ambulatory Visit: Payer: Self-pay

## 2018-04-14 ENCOUNTER — Encounter (HOSPITAL_COMMUNITY): Payer: Self-pay | Admitting: Emergency Medicine

## 2018-04-14 DIAGNOSIS — M5431 Sciatica, right side: Secondary | ICD-10-CM

## 2018-04-14 DIAGNOSIS — M5441 Lumbago with sciatica, right side: Secondary | ICD-10-CM

## 2018-04-14 MED ORDER — TIZANIDINE HCL 4 MG PO TABS: 4.0000 mg | ORAL_TABLET | Freq: Four times a day (QID) | ORAL | 0 refills | Status: DC | PRN

## 2018-04-14 MED ORDER — TRAMADOL HCL 50 MG PO TABS: 50.0000 mg | ORAL_TABLET | Freq: Four times a day (QID) | ORAL | 0 refills | Status: DC | PRN

## 2018-04-14 MED ORDER — METHYLPREDNISOLONE 4 MG PO TBPK: ORAL_TABLET | ORAL | 0 refills | Status: DC

## 2018-04-14 NOTE — ED Provider Notes (Signed)
[facility]    CSN: [id number] Arrival date & time: 04/14/18  1951     History   Chief Complaint Chief Complaint  Patient presents with  . Back Pain  . Leg Pain    HPI Cody [name] is a 57 y.o. male.   HPI  Patient is here for back pain.  He has pain in his right low back that radiates down the back of his right leg.  He describes pain as severe.  He is having difficulty with ambulating.  He spent the day in bed.  The pain is no better.  He is never had back problems before.  He has never had sciatica before. Patient works as a Lawyer at a nursing home.  He states that he has to do a lot of bending and lifting.  He states he had a particularly difficult day yesterday.  He struggled to get through all the lifting that was due.  He thinks repetitive heavy lifting caused him to have this problem.  He did not have any accident injury slip or twist that he can recall that might have caused him to have problems.  He woke up this morning and pain.  He has not taken any medicine.  He is tried to rest. He has no bowel or bladder complaints.  No incontinence.  He has no recent illness.  No recent injury .  No history of malignancy.  Past Medical History:  Diagnosis Date  . Hypertension   . Morbid obesity [facility])     Patient Active Problem List   Diagnosis Date Noted  . Posterior tibial tendinitis, left leg 01/05/2018  . Traumatic arthritis of left ankle 12/15/2017  . Hallux rigidus, left foot 12/15/2017  . Achilles tendon contracture, left 12/15/2017  . Acquired pes planus 11/21/2017  . Lower extremity edema 03/30/2017  . Hematuria 03/30/2017  . Prediabetes 12/31/2016  . Dyspnea on exertion 12/31/2016  . Plantar fasciitis 01/01/2016  . Osteoarthritis of ankle and foot 01/01/2016  . Plantar fasciitis, bilateral 10/20/2015  . Morbid obesity (HCC) 10/20/2015  . Essential hypertension 10/20/2015    Past Surgical History:  Procedure Laterality Date  . ANKLE FUSION     left ankle 1980'S  . FOOT BONE EXCISION         Home Medications    Prior to Admission medications   Medication Sig Start Date End Date Taking? Authorizing Provider  spironolactone (ALDACTONE) 25 MG tablet Take 1 tablet (25 mg total) daily by mouth. 09/17/17  Yes Massie Maroon, FNP  methylPREDNISolone (MEDROL DOSEPAK) 4 MG TBPK tablet tad 04/14/18   Eustace Moore, MD  tiZANidine (ZANAFLEX) 4 MG tablet Take 1 tablet (4 mg total) by mouth every 6 (six) hours as needed for muscle spasms. 04/14/18   Eustace Moore, MD  traMADol (ULTRAM) 50 MG tablet Take 1 tablet (50 mg total) by mouth every 6 (six) hours as needed. 04/14/18   Eustace Moore, MD    Family History History reviewed. No pertinent family history.  Social History Social History   Tobacco Use  . Smoking status: Former Games developer  . Smokeless tobacco: Never Used  Substance Use Topics  . Alcohol use: Yes    Alcohol/week: 0.0 oz    Comment: occ  . Drug use: No     Allergies   Patient has no known allergies.   Review of Systems Review of Systems  Constitutional: Negative for chills and fever.  HENT: Negative for ear pain  and sore throat.   Eyes: Negative for pain and visual disturbance.  Respiratory: Negative for cough and shortness of breath.   Cardiovascular: Negative for chest pain and palpitations.  Gastrointestinal: Negative for abdominal pain and vomiting.  Genitourinary: Negative for dysuria and hematuria.  Musculoskeletal: Positive for back pain and gait problem. Negative for arthralgias.  Skin: Negative for color change and rash.  Neurological: Negative for seizures, syncope and weakness.  All other systems reviewed and are negative.    Physical Exam Triage Vital Signs ED Triage Vitals [04/14/18 2040]  Enc Vitals Group     BP (!) 153/86     Pulse Rate 98     Resp 20     Temp 98.6 F (37 C)     Temp Source Oral     SpO2 100 %     Weight      Height      Head Circumference       Peak Flow      Pain Score 7     Pain Loc      Pain Edu?      Excl. in GC?    No data found.  Updated Vital Signs BP (!) 153/86 (BP Location: Left Arm)   Pulse 98   Temp 98.6 F (37 C) (Oral)   Resp 20   SpO2 100%   Visual Acuity Right Eye Distance:   Left Eye Distance:   Bilateral Distance:    Right Eye Near:   Left Eye Near:    Bilateral Near:     Physical Exam  Constitutional: He appears well-developed and well-nourished. He appears distressed.  Morbidly obese gentleman.  Walks in apparent discomfort.  Resist putting full weight on right leg.  HENT:  Head: Normocephalic and atraumatic.  Mouth/Throat: Oropharynx is clear and moist.  Eyes: Pupils are equal, round, and reactive to light. Conjunctivae are normal.  Neck: Normal range of motion.  Cardiovascular: Normal rate, regular rhythm and normal heart sounds.  Pulmonary/Chest: Effort normal and breath sounds normal. No respiratory distress.  Abdominal: Soft. He exhibits no distension.  Musculoskeletal: Normal range of motion. He exhibits no edema.  Patient has tenderness palpation centrally over the lumbar spine, over the posterior pelvis, and over the right SI joint.  No palpable muscle spasm.  He has exaggerated vocalizing and pulling away from even moderate touch over the structures.  He is unable to perform he complains of pain with axial loading.  Range of motion testing because of complaint of pain.  Reflexes are absent at knee and ankle.  Sensory exam is intact.  Straight leg raise is positive on the right for increased leg pain at 30 degrees of extension.  Straight leg raise is negative on the left.  Neurological: He is alert.  Skin: Skin is warm and dry.     UC Treatments / Results  Labs (all labs ordered are listed, but only abnormal results are displayed) Labs Reviewed - No data to display  EKG None  Radiology No results found.  Procedures Procedures (including critical care time)  Medications  Ordered in UC Medications - No data to display  Initial Impression / Assessment and Plan / UC Course  I have reviewed the triage vital signs and the nursing notes.  Pertinent labs & imaging results that were available during my care of the patient were reviewed by me and considered in my medical decision making (see chart for details).     Patient with low back pain  and right-sided radiculopathy.  He believes it is due to his usual work activities although he did not have any accident.  I told him that he needs to report this to employer and follow-up with the Worker's Comp. clinic if he believes this is work-related.  We discussed causes of radiculopathy.  We discussed conservative management of low back pain.  X-rays are not indicated with no trauma. Final Clinical Impressions(s) / UC Diagnoses   Final diagnoses:  Sciatica of right side  Acute right-sided low back pain with right-sided sciatica     Discharge Instructions     Report to your job if you feel this injury is due to work Follow up with workers comp clinic  You have nerve root inflammation in the right leg This can have many causes It is treated with anti inflammatory medicine The strongest medicine to start is prednisone Take as directed Take the pain medicine and muscle relaxers as needed Activity as tolerated    ED Prescriptions    Medication Sig Dispense Auth. Provider   methylPREDNISolone (MEDROL DOSEPAK) 4 MG TBPK tablet tad 21 tablet Eustace Moore, MD   tiZANidine (ZANAFLEX) 4 MG tablet Take 1 tablet (4 mg total) by mouth every 6 (six) hours as needed for muscle spasms. 30 tablet Eustace Moore, MD   traMADol (ULTRAM) 50 MG tablet Take 1 tablet (50 mg total) by mouth every 6 (six) hours as needed. 20 tablet Eustace Moore, MD     Controlled Substance Prescriptions [place] Controlled Substance Registry consulted? Not Applicable   Eustace Moore, MD 04/14/18 2111

## 2018-04-14 NOTE — ED Triage Notes (Signed)
[facility] a [facility] of lower right back pain that radiates into her right leg. 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

## 2018-04-14 NOTE — Discharge Instructions (Signed)
Report to your job if you feel this injury is due to work Follow up with workers comp clinic  You have nerve root inflammation in the right leg This can have many causes It is treated with anti inflammatory medicine The strongest medicine to start is prednisone Take as directed Take the pain medicine and muscle relaxers as needed Activity as tolerated

## 2018-05-18 ENCOUNTER — Ambulatory Visit: Payer: Self-pay | Admitting: Family Medicine

## 2018-05-27 ENCOUNTER — Other Ambulatory Visit (INDEPENDENT_AMBULATORY_CARE_PROVIDER_SITE_OTHER): Payer: Self-pay | Admitting: Orthopedic Surgery

## 2018-05-27 DIAGNOSIS — M25872 Other specified joint disorders, left ankle and foot: Secondary | ICD-10-CM

## 2018-06-04 MED FILL — SPIRONOLACTONE 25 MG TABLET: 25 | 30 days supply | Qty: 30 | Fill #5

## 2018-06-17 ENCOUNTER — Ambulatory Visit: Payer: Self-pay | Attending: Family Medicine

## 2018-06-30 MED ORDER — DEXTROSE 5 % IV SOLN
3.0000 g | INTRAVENOUS | Status: DC
  Filled 2018-06-30: qty 3000

## 2018-06-30 NOTE — Progress Notes (Signed)
Left several voice messages on Cheryl, Surgical Coordinator's phone making her aware that pt stated that he was cancelling surgery due to death in the family; still awaiting a return call. Dr. Lajoyce Cornersuda made aware that pt canceled and advised that case be canceled.

## 2018-07-01 ENCOUNTER — Ambulatory Visit (HOSPITAL_COMMUNITY): Admission: RE | Admit: 2018-07-01 | Payer: Self-pay | Source: Ambulatory Visit | Admitting: Orthopedic Surgery

## 2018-07-01 ENCOUNTER — Encounter (HOSPITAL_COMMUNITY): Admission: RE | Payer: Self-pay | Source: Ambulatory Visit

## 2018-07-01 SURGERY — ARTHROSCOPY, ANKLE
Anesthesia: General | Laterality: Left

## 2018-07-31 ENCOUNTER — Encounter (HOSPITAL_COMMUNITY): Payer: Self-pay

## 2018-07-31 ENCOUNTER — Ambulatory Visit (HOSPITAL_COMMUNITY)
Admission: EM | Admit: 2018-07-31 | Discharge: 2018-07-31 | Disposition: A | Discharge status: Home / Self Care | Payer: Self-pay | Attending: Family Medicine | Admitting: Family Medicine

## 2018-07-31 DIAGNOSIS — K122 Cellulitis and abscess of mouth: Secondary | ICD-10-CM

## 2018-07-31 MED ORDER — CEFTRIAXONE SODIUM 1 G IJ SOLR
1.0000 g | Freq: Once | INTRAMUSCULAR | Status: AC
  Administered 2018-07-31: 1 g via INTRAMUSCULAR

## 2018-07-31 MED ORDER — AMOXICILLIN-POT CLAVULANATE 875-125 MG PO TABS: 1.0000 | ORAL_TABLET | Freq: Two times a day (BID) | ORAL | 0 refills | Status: DC

## 2018-07-31 MED ORDER — CEFTRIAXONE SODIUM 1 G IJ SOLR
INTRAMUSCULAR | Status: AC
  Filled 2018-07-31: qty 10

## 2018-07-31 MED ORDER — LIDOCAINE HCL (PF) 1 % IJ SOLN
INTRAMUSCULAR | Status: AC
  Filled 2018-07-31: qty 4

## 2018-07-31 NOTE — ED Triage Notes (Signed)
Pt presents with abscess on upper lip.

## 2018-07-31 NOTE — ED Provider Notes (Signed)
[facility]    CSN: [id number] Arrival date & time: 07/31/18  1922     History   Chief Complaint Chief Complaint  Patient presents with  . Abscess    Lip    HPI Cody [name] is a 57 y.o. male.   Pt presents with abscess on upper lip.  Patient developed swelling in his upper lip, right side, yesterday with intermittent pain.  He recalls no trauma.  He has had no trouble with his teeth.     Past Medical History:  Diagnosis Date  . Hypertension   . Morbid obesity [facility])     Patient Active Problem List   Diagnosis Date Noted  . Posterior tibial tendinitis, left leg 01/05/2018  . Traumatic arthritis of left ankle 12/15/2017  . Hallux rigidus, left foot 12/15/2017  . Achilles tendon contracture, left 12/15/2017  . Acquired pes planus 11/21/2017  . Lower extremity edema 03/30/2017  . Hematuria 03/30/2017  . Prediabetes 12/31/2016  . Dyspnea on exertion 12/31/2016  . Plantar fasciitis 01/01/2016  . Osteoarthritis of ankle and foot 01/01/2016  . Plantar fasciitis, bilateral 10/20/2015  . Morbid obesity (HCC) 10/20/2015  . Essential hypertension 10/20/2015    Past Surgical History:  Procedure Laterality Date  . ANKLE FUSION     left ankle 1980'S  . FOOT BONE EXCISION         Home Medications    Prior to Admission medications   Medication Sig Start Date End Date Taking? Authorizing Provider  amoxicillin-clavulanate (AUGMENTIN) 875-125 MG tablet Take 1 tablet by mouth every 12 (twelve) hours. 07/31/18   Elvina Sidle, MD  aspirin EC 81 MG tablet Take 81 mg by mouth daily.    [provider]  Multiple Vitamins-Minerals (CENTRUM SILVER PO) Take 1 tablet by mouth daily.    [provider]  spironolactone (ALDACTONE) 25 MG tablet Take 1 tablet (25 mg total) daily by mouth. 09/17/17   Massie Maroon, FNP  triamcinolone cream (KENALOG) 0.1 % Apply 1 application topically 2 (two) times daily.    [provider]     Family History History reviewed. No pertinent family history.  Social History Social History   Tobacco Use  . Smoking status: Former Games developer  . Smokeless tobacco: Never Used  Substance Use Topics  . Alcohol use: Yes    Alcohol/week: 0.0 standard drinks    Comment: occ  . Drug use: No     Allergies   Patient has no known allergies.   Review of Systems Review of Systems   Physical Exam Triage Vital Signs ED Triage Vitals [07/31/18 2036]  Enc Vitals Group     BP (!) 142/87     Pulse Rate 91     Resp 20     Temp 98.8 F (37.1 C)     Temp Source Oral     SpO2 98 %     Weight      Height      Head Circumference      Peak Flow      Pain Score      Pain Loc      Pain Edu?      Excl. in GC?    No data found.  Updated Vital Signs BP (!) 142/87   Pulse 91   Temp 98.8 F (37.1 C) (Oral)   Resp 20   SpO2 98%    Physical Exam  Constitutional: He is oriented to person, place, and time. He appears well-developed  and well-nourished.  HENT:  Right Ear: External ear normal.  Left Ear: External ear normal.  Swollen upper lip  See photograph  Eyes: Conjunctivae are normal.  Neck: Normal range of motion. Neck supple.  Pulmonary/Chest: Effort normal.  Musculoskeletal: Normal range of motion.  Lymphadenopathy:    He has no cervical adenopathy.  Neurological: He is alert and oriented to person, place, and time.  Skin: Skin is warm and dry.  Nursing note and vitals reviewed.      UC Treatments / Results  Labs (all labs ordered are listed, but only abnormal results are displayed) Labs Reviewed - No data to display  EKG None  Radiology No results found.  Procedures Procedures (including critical care time)  Medications Ordered in UC Medications  cefTRIAXone (ROCEPHIN) injection 1 g (has no administration in time range)    Initial Impression / Assessment and Plan / UC Course  I have reviewed the triage vital signs and the nursing  notes.  Pertinent labs & imaging results that were available during my care of the patient were reviewed by me and considered in my medical decision making (see chart for details).     Final Clinical Impressions(s) / UC Diagnoses   Final diagnoses:  Oral abscess   Discharge Instructions   None    ED Prescriptions    Medication Sig Dispense Auth. Provider   amoxicillin-clavulanate (AUGMENTIN) 875-125 MG tablet Take 1 tablet by mouth every 12 (twelve) hours. 14 tablet Elvina Sidle, MD     Controlled Substance Prescriptions [place] Controlled Substance Registry consulted? Not Applicable   Elvina Sidle, MD 07/31/18 2052

## 2018-10-30 ENCOUNTER — Ambulatory Visit (HOSPITAL_COMMUNITY)
Admission: EM | Admit: 2018-10-30 | Discharge: 2018-10-30 | Disposition: A | Discharge status: Home / Self Care | Payer: Self-pay | Attending: Family Medicine | Admitting: Family Medicine

## 2018-10-30 ENCOUNTER — Encounter (HOSPITAL_COMMUNITY): Payer: Self-pay | Admitting: Emergency Medicine

## 2018-10-30 DIAGNOSIS — J020 Streptococcal pharyngitis: Secondary | ICD-10-CM | POA: Insufficient documentation

## 2018-10-30 DIAGNOSIS — I10 Essential (primary) hypertension: Secondary | ICD-10-CM

## 2018-10-30 DIAGNOSIS — Z20818 Contact with and (suspected) exposure to other bacterial communicable diseases: Secondary | ICD-10-CM

## 2018-10-30 LAB — POCT RAPID STREP A: STREPTOCOCCUS, GROUP A SCREEN (DIRECT): POSITIVE — AB

## 2018-10-30 MED ORDER — KETOROLAC TROMETHAMINE 60 MG/2ML IM SOLN
INTRAMUSCULAR | Status: AC
  Filled 2018-10-30: qty 2

## 2018-10-30 MED ORDER — AMOXICILLIN 875 MG PO TABS: 875.0000 mg | ORAL_TABLET | Freq: Two times a day (BID) | ORAL | 0 refills | Status: AC

## 2018-10-30 MED ORDER — DEXAMETHASONE SODIUM PHOSPHATE 10 MG/ML IJ SOLN
10.0000 mg | Freq: Once | INTRAMUSCULAR | Status: AC
  Administered 2018-10-30: 10 mg via INTRAMUSCULAR

## 2018-10-30 MED ORDER — DEXAMETHASONE SODIUM PHOSPHATE 10 MG/ML IJ SOLN
INTRAMUSCULAR | Status: AC
  Filled 2018-10-30: qty 1

## 2018-10-30 MED ORDER — KETOROLAC TROMETHAMINE 60 MG/2ML IM SOLN
60.0000 mg | Freq: Once | INTRAMUSCULAR | Status: AC
  Administered 2018-10-30: 60 mg via INTRAMUSCULAR

## 2018-10-30 NOTE — Discharge Instructions (Signed)
Use salt water gargles or Chloraseptic spray for throat pain Take Tylenol or ibuprofen for pain Take 10 full days of antibiotics Return if worse instead of better at any time

## 2018-10-30 NOTE — ED Provider Notes (Signed)
[facility]    CSN: [id number] Arrival date & time: 10/30/18  1832     History   Chief Complaint Chief Complaint  Patient presents with  . Sore Throat    HPI Cody [name] is a 57 y.o. male.   HPI  Severe sore throat for 2 days.  Can hardly swallow.  Positive strep exposure.  Fever.  Body aches.  Very tired but is unable to sleep due to pain.  Did not take his temperature at home but thinks he does have fever.  No nasal congestion, no flu symptoms, no cough.  Past Medical History:  Diagnosis Date  . Hypertension   . Morbid obesity [facility])     Patient Active Problem List   Diagnosis Date Noted  . Posterior tibial tendinitis, left leg 01/05/2018  . Traumatic arthritis of left ankle 12/15/2017  . Hallux rigidus, left foot 12/15/2017  . Achilles tendon contracture, left 12/15/2017  . Acquired pes planus 11/21/2017  . Lower extremity edema 03/30/2017  . Hematuria 03/30/2017  . Prediabetes 12/31/2016  . Dyspnea on exertion 12/31/2016  . Plantar fasciitis 01/01/2016  . Osteoarthritis of ankle and foot 01/01/2016  . Plantar fasciitis, bilateral 10/20/2015  . Morbid obesity (HCC) 10/20/2015  . Essential hypertension 10/20/2015    Past Surgical History:  Procedure Laterality Date  . ANKLE FUSION     left ankle 1980'S  . FOOT BONE EXCISION         Home Medications    Prior to Admission medications   Medication Sig Start Date End Date Taking? Authorizing Provider  amoxicillin (AMOXIL) 875 MG tablet Take 1 tablet (875 mg total) by mouth 2 (two) times daily for 10 days. 10/30/18 11/09/18  Eustace Moore, MD  aspirin EC 81 MG tablet Take 81 mg by mouth daily.    [provider]  Multiple Vitamins-Minerals (CENTRUM SILVER PO) Take 1 tablet by mouth daily.    [provider]  spironolactone (ALDACTONE) 25 MG tablet Take 1 tablet (25 mg total) daily by mouth. Patient not taking: Reported on 10/30/2018 09/17/17   Massie Maroon, FNP    triamcinolone cream (KENALOG) 0.1 % Apply 1 application topically 2 (two) times daily.    [provider]    Family History No family history on file.  Social History Social History   Tobacco Use  . Smoking status: Former Games developer  . Smokeless tobacco: Never Used  Substance Use Topics  . Alcohol use: Yes    Alcohol/week: 0.0 standard drinks    Comment: occ  . Drug use: No     Allergies   Patient has no known allergies.   Review of Systems Review of Systems  Constitutional: Positive for fatigue and fever. Negative for chills.  HENT: Positive for sore throat and trouble swallowing. Negative for ear pain.   Eyes: Negative for pain and visual disturbance.  Respiratory: Negative for cough and shortness of breath.   Cardiovascular: Negative for chest pain and palpitations.  Gastrointestinal: Negative for abdominal pain and vomiting.  Genitourinary: Negative for dysuria and hematuria.  Musculoskeletal: Negative for arthralgias and back pain.  Skin: Negative for color change and rash.  Neurological: Negative for seizures and syncope.  All other systems reviewed and are negative.    Physical Exam Triage Vital Signs ED Triage Vitals [10/30/18 1941]  Enc Vitals Group     BP (!) 159/95     Pulse Rate 94     Resp 18  Temp 99.5 F (37.5 C)     Temp src      SpO2 98 %     Weight      Height      Head Circumference      Peak Flow      Pain Score 9     Pain Loc      Pain Edu?      Excl. in GC?    No data found.  Updated Vital Signs BP (!) 159/95   Pulse 94   Temp 99.5 F (37.5 C)   Resp 18   SpO2 98%      Physical Exam Constitutional:      General: He is not in acute distress.    Appearance: He is well-developed. He is obese.  HENT:     Head: Normocephalic and atraumatic.     Right Ear: Tympanic membrane and ear canal normal.     Left Ear: Tympanic membrane and ear canal normal.     Nose: No congestion.     Mouth/Throat:     Mouth: Mucous  membranes are moist.     Pharynx: Posterior oropharyngeal erythema and uvula swelling present.     Tonsils: No tonsillar exudate. Swelling: 2+ on the right. 2+ on the left.  Eyes:     Conjunctiva/sclera: Conjunctivae normal.     Pupils: Pupils are equal, round, and reactive to light.  Neck:     Musculoskeletal: Normal range of motion.  Cardiovascular:     Rate and Rhythm: Normal rate and regular rhythm.     Heart sounds: Normal heart sounds.  Pulmonary:     Effort: Pulmonary effort is normal. No respiratory distress.     Breath sounds: Normal breath sounds.  Abdominal:     General: There is no distension.     Palpations: Abdomen is soft.  Musculoskeletal: Normal range of motion.  Lymphadenopathy:     Cervical: Cervical adenopathy present.  Skin:    General: Skin is warm and dry.  Neurological:     General: No focal deficit present.     Mental Status: He is alert.  Psychiatric:        Mood and Affect: Mood normal.      UC Treatments / Results  Labs (all labs ordered are listed, but only abnormal results are displayed) Labs Reviewed  POCT RAPID STREP A - Abnormal; Notable for the following components:      Result Value   Streptococcus, Group A Screen (Direct) POSITIVE (*)    All other components within normal limits    EKG None  Radiology No results found.  Procedures Procedures (including critical care time)  Medications Ordered in UC Medications  dexamethasone (DECADRON) injection 10 mg (10 mg Intramuscular Given 10/30/18 2032)  ketorolac (TORADOL) injection 60 mg (60 mg Intramuscular Given 10/30/18 2032)    Initial Impression / Assessment and Plan / UC Course  I have reviewed the triage vital signs and the nursing notes.  Pertinent labs & imaging results that were available during my care of the patient were reviewed by me and considered in my medical decision making (see chart for details).     Patient has a very painful sore throat.  Strep positive.   He is given a shot of Decadron for the pain and swelling.  Shot of Toradol for pain.  Home for 2 fluids and 10 days of antibiotics.  He is to return if worse at any time instead of better, or if  unable to swallow and keep down his antibiotic pills Final Clinical Impressions(s) / UC Diagnoses   Final diagnoses:  Strep throat     Discharge Instructions     Use salt water gargles or Chloraseptic spray for throat pain Take Tylenol or ibuprofen for pain Take 10 full days of antibiotics Return if worse instead of better at any time   ED Prescriptions    Medication Sig Dispense Auth. Provider   amoxicillin (AMOXIL) 875 MG tablet Take 1 tablet (875 mg total) by mouth 2 (two) times daily for 10 days. 20 tablet Eustace MooreNelson,  Sue, MD     Controlled Substance Prescriptions [place] Controlled Substance Registry consulted? Not Applicable   Eustace MooreNelson,  Sue, MD 10/30/18 2108

## 2018-10-30 NOTE — ED Triage Notes (Signed)
Pt c/o sore throat x2 days 

## 2019-01-23 ENCOUNTER — Ambulatory Visit (HOSPITAL_COMMUNITY)
Admission: EM | Admit: 2019-01-23 | Discharge: 2019-01-23 | Disposition: A | Discharge status: Home / Self Care | Payer: Self-pay | Attending: Family Medicine | Admitting: Family Medicine

## 2019-01-23 ENCOUNTER — Other Ambulatory Visit: Payer: Self-pay

## 2019-01-23 ENCOUNTER — Encounter (HOSPITAL_COMMUNITY): Payer: Self-pay | Admitting: *Deleted

## 2019-01-23 DIAGNOSIS — I10 Essential (primary) hypertension: Secondary | ICD-10-CM

## 2019-01-23 MED ORDER — AMLODIPINE BESYLATE 5 MG PO TABS: 5.0000 mg | ORAL_TABLET | Freq: Every day | ORAL | 1 refills | Status: DC

## 2019-01-23 NOTE — ED Triage Notes (Signed)
Pt believes he has HTN; c/o HA x 1 wk.  Denies blurred vision or other sxs.  Ran out of HTN meds x 2 months ago.

## 2019-01-23 NOTE — ED Provider Notes (Signed)
[facility]   [id number] 01/23/19 Arrival Time: 1111  ASSESSMENT & PLAN:  1. Essential hypertension    Will start back on: Meds ordered this encounter  Medications  . amLODipine (NORVASC) 5 MG tablet    Sig: Take 1 tablet (5 mg total) by mouth daily.    Dispense:  30 tablet    Refill:  1    Follow-up Information    Schedule an appointment as soon as possible for a visit  with Aliene Beams, MD.   Specialty:  Family Medicine Contact information: [address] [place] [address] [phone]          May f/u here within the next week to recheck BP.  Reviewed expectations re: course of current medical issues. Questions answered. Outlined signs and symptoms indicating need for more acute intervention. Patient verbalized understanding. After Visit Summary given.   SUBJECTIVE:  Cody [name] is a 58 y.o. male who presents with concerns regarding increased blood pressures. He reports that he has been treated for hypertension in the past. Out o fmedications for 2 months. Occasional mild headaches that do not limit ADLs.  He reports no chest pain on exertion, no dyspnea on exertion, no swelling of ankles, no orthostatic dizziness or lightheadedness, no orthopnea or paroxysmal nocturnal dyspnea and no palpitations.  Denies symptoms of chest pain, palpations, orthopnea, nocturnal dyspnea, or LE edema.  Social History   Tobacco Use  Smoking Status Current Some Day Smoker  Smokeless Tobacco Never Used   ROS: As per HPI. All other systems negative.    OBJECTIVE:  Vitals:   01/23/19 1145  BP: (!) 145/107  Pulse: 83  Resp: 20  Temp: 98 F (36.7 C)  TempSrc: Oral  SpO2: 96%    General appearance: alert; no distress Eyes: PERRLA; EOMI HENT: normocephalic; atraumatic Neck: supple Lungs: clear to auscultation bilaterally Heart: regular rate and rhythm without murmer Abdomen: soft, non-tender; bowel sounds normal Extremities: no edema;  symmetrical with no gross deformities Skin: warm and dry Psychological: alert and cooperative; normal mood and affect   No Known Allergies  Past Medical History:  Diagnosis Date  . Hypertension   . Morbid obesity (HCC)    Social History   Socioeconomic History  . Marital status: Single    Spouse name: Not on file  . Number of children: Not on file  . Years of education: Not on file  . Highest education level: Not on file  Occupational History  . Not on file  Social Needs  . Financial resource strain: Not on file  . Food insecurity:    Worry: Not on file    Inability: Not on file  . Transportation needs:    Medical: Not on file    Non-medical: Not on file  Tobacco Use  . Smoking status: Current Some Day Smoker  . Smokeless tobacco: Never Used  Substance and Sexual Activity  . Alcohol use: Yes    Comment: occasionally  . Drug use: No  . Sexual activity: Not on file  Lifestyle  . Physical activity:    Days per week: Not on file    Minutes per session: Not on file  . Stress: Not on file  Relationships  . Social connections:    Talks on phone: Not on file    Gets together: Not on file    Attends religious service: Not on file    Active member of club or organization: Not on file    Attends meetings  of clubs or organizations: Not on file    Relationship status: Not on file  . Intimate partner violence:    Fear of current or ex partner: Not on file    Emotionally abused: Not on file    Physically abused: Not on file    Forced sexual activity: Not on file  Other Topics Concern  . Not on file  Social History Narrative  . Not on file   Family History  Problem Relation Age of Onset  . Cancer Mother   . Aneurysm Father    Past Surgical History:  Procedure Laterality Date  . ANKLE FUSION     left ankle 1980'S  . FOOT BONE EXCISION        Mardella Layman, MD 01/26/19 [phone]

## 2019-01-24 ENCOUNTER — Telehealth (HOSPITAL_COMMUNITY): Payer: Self-pay

## 2019-01-24 MED ORDER — AMLODIPINE BESYLATE 5 MG PO TABS: 5.0000 mg | ORAL_TABLET | Freq: Every day | ORAL | 1 refills | Status: DC

## 2019-01-28 ENCOUNTER — Ambulatory Visit (HOSPITAL_COMMUNITY)
Admission: EM | Admit: 2019-01-28 | Discharge: 2019-01-28 | Disposition: A | Discharge status: Home / Self Care | Payer: Self-pay | Attending: Family Medicine | Admitting: Family Medicine

## 2019-01-28 ENCOUNTER — Encounter (HOSPITAL_COMMUNITY): Payer: Self-pay | Admitting: Family Medicine

## 2019-01-28 ENCOUNTER — Other Ambulatory Visit: Payer: Self-pay

## 2019-01-28 DIAGNOSIS — M722 Plantar fascial fibromatosis: Secondary | ICD-10-CM | POA: Insufficient documentation

## 2019-01-28 DIAGNOSIS — N451 Epididymitis: Secondary | ICD-10-CM | POA: Insufficient documentation

## 2019-01-28 DIAGNOSIS — N390 Urinary tract infection, site not specified: Secondary | ICD-10-CM | POA: Insufficient documentation

## 2019-01-28 LAB — POCT URINALYSIS DIP (DEVICE)
GLUCOSE, UA: NEGATIVE mg/dL
LEUKOCYTE UA: NEGATIVE
NITRITE: POSITIVE — AB
Protein, ur: 30 mg/dL — AB
Specific Gravity, Urine: >=1.03 (ref 1.005–1.030)
Urobilinogen, UA: 1 mg/dL (ref 0.0–1.0)
pH: 5.5 (ref 5.0–8.0)

## 2019-01-28 MED ORDER — AZITHROMYCIN 250 MG PO TABS
1000.0000 mg | ORAL_TABLET | Freq: Once | ORAL | Status: AC
  Administered 2019-01-28: 1000 mg via ORAL

## 2019-01-28 MED ORDER — LEVOFLOXACIN 500 MG PO TABS: 500.0000 mg | ORAL_TABLET | Freq: Every day | ORAL | 0 refills | Status: DC

## 2019-01-28 MED ORDER — AZITHROMYCIN 250 MG PO TABS
ORAL_TABLET | ORAL | Status: AC
  Filled 2019-01-28: qty 4

## 2019-01-28 MED FILL — levoFLOXacin 500 MG TABS: 500 | 7 days supply | Qty: 7 | Fill #0

## 2019-01-28 NOTE — Discharge Instructions (Addendum)
If you do not improve in 48 hours, go to the emergency department for an ultrasound  I am referring you to the foot center to have further evaluation for the plantar fasciitis.  It seems that you have tried everything that we have to offer here and so I would like a specialist to consider other, alternative therapies.

## 2019-01-28 NOTE — ED Triage Notes (Signed)
PT reports bilateral testicle swelling. PT reports left lower abdominal / groin pain. PT's urination has decreased and urine is dark in color. PT urinates frequently throughout the night, but there is not much volume. This has been going on for a week.

## 2019-01-28 NOTE — ED Provider Notes (Addendum)
[facility]    CSN: [id number] Arrival date & time: 01/28/19  1333     History   Chief Complaint Chief Complaint  Patient presents with  . Testicle Pain  . Dysuria    HPI Cody [name] is a 58 y.o. male.   This is a 58 year old man who is here just 4 days ago for hypertension complaints.  He comes in today with bilateral testicle swelling that he says is been going on for a week.  He is also complaining about left plantar fasciitis which he has had for years and is responsible for his disability status.   He has never tried special shoes, but has used medications and cortisone shot.   Patient says he cannot understand why he has had 5 years of plantar fasciitis, though he does weigh close to 450 lbs.  No dysuria or h/o orchitis.  Patient has not had sex for over a year.  PT reports bilateral testicle swelling. PT reports left lower abdominal / groin pain. PT's urination has decreased and urine is dark in color. PT urinates frequently throughout the night, but there is not much volume. This has been going on for a week.      Past Medical History:  Diagnosis Date  . Hypertension   . Morbid obesity [facility])     Patient Active Problem List   Diagnosis Date Noted  . Posterior tibial tendinitis, left leg 01/05/2018  . Traumatic arthritis of left ankle 12/15/2017  . Hallux rigidus, left foot 12/15/2017  . Achilles tendon contracture, left 12/15/2017  . Acquired pes planus 11/21/2017  . Lower extremity edema 03/30/2017  . Hematuria 03/30/2017  . Prediabetes 12/31/2016  . Dyspnea on exertion 12/31/2016  . Plantar fasciitis 01/01/2016  . Osteoarthritis of ankle and foot 01/01/2016  . Plantar fasciitis, bilateral 10/20/2015  . Morbid obesity (HCC) 10/20/2015  . Essential hypertension 10/20/2015    Past Surgical History:  Procedure Laterality Date  . ANKLE FUSION     left ankle 1980'S  . FOOT BONE EXCISION         Home Medications    Prior to  Admission medications   Medication Sig Start Date End Date Taking? Authorizing Provider  amLODipine (NORVASC) 5 MG tablet Take 1 tablet (5 mg total) by mouth daily. 01/24/19  Yes Mardella Layman, MD  aspirin EC 81 MG tablet Take 81 mg by mouth daily.   Yes [provider]  levofloxacin (LEVAQUIN) 500 MG tablet Take 1 tablet (500 mg total) by mouth daily. 01/28/19   Elvina Sidle, MD  Multiple Vitamins-Minerals (CENTRUM SILVER PO) Take 1 tablet by mouth daily.    [provider]  triamcinolone cream (KENALOG) 0.1 % Apply 1 application topically 2 (two) times daily.    [provider]    Family History Family History  Problem Relation Age of Onset  . Cancer Mother   . Aneurysm Father     Social History Social History   Tobacco Use  . Smoking status: Current Some Day Smoker  . Smokeless tobacco: Never Used  Substance Use Topics  . Alcohol use: Yes    Comment: occasionally  . Drug use: No     Allergies   Patient has no known allergies.   Review of Systems Review of Systems  Genitourinary: Positive for testicular pain.  Musculoskeletal: Positive for gait problem.     Physical Exam Triage Vital Signs ED Triage Vitals [01/28/19 1358]  Enc Vitals Group     BP  Pulse      Resp      Temp      Temp src      SpO2      Weight      Height      Head Circumference      Peak Flow      Pain Score 8     Pain Loc      Pain Edu?      Excl. in GC?    No data found.  Updated Vital Signs BP (!) 146/93   Pulse (!) 122   Temp 99.5 F (37.5 C) (Oral)   Resp 20   SpO2 96%    Physical Exam Vitals signs and nursing note reviewed.  Constitutional:      Appearance: He is obese.  HENT:     Head: Normocephalic.  Eyes:     Conjunctiva/sclera: Conjunctivae normal.  Neck:     Musculoskeletal: Normal range of motion and neck supple.  Pulmonary:     Effort: Pulmonary effort is normal.  Genitourinary:    Comments: Mildly swollen left testicle  which is quite tender.  The overlying scrotum is reddened.  Perineum is obscured by large panniculus.  Patient does have some tenderness in his left groin. Skin:    Findings: Erythema present.     Comments: There is a large amount of interdigital toe debris.  He is tender diffusely in the left arch.  There is no redness there.  Neurological:     Mental Status: He is alert.      UC Treatments / Results  Labs (all labs ordered are listed, but only abnormal results are displayed) Labs Reviewed  POCT URINALYSIS DIP (DEVICE) - Abnormal; Notable for the following components:      Result Value   Bilirubin Urine SMALL (*)    Ketones, ur TRACE (*)    Hgb urine dipstick MODERATE (*)    Protein, ur 30 (*)    Nitrite POSITIVE (*)    All other components within normal limits  URINE CULTURE    EKG None  Radiology No results found.  Procedures Procedures (including critical care time)  Medications Ordered in UC Medications  azithromycin (ZITHROMAX) tablet 1,000 mg (1,000 mg Oral Given 01/28/19 1500)    Initial Impression / Assessment and Plan / UC Course  I have reviewed the triage vital signs and the nursing notes.  Pertinent labs & imaging results that were available during my care of the patient were reviewed by me and considered in my medical decision making (see chart for details).    Final Clinical Impressions(s) / UC Diagnoses   Final diagnoses:  Epididymitis  Plantar fasciitis of left foot  Lower urinary tract infectious disease     Discharge Instructions     If you do not improve in 48 hours, go to the emergency department for an ultrasound  I am referring you to the foot center to have further evaluation for the plantar fasciitis.  It seems that you have tried everything that we have to offer here and so I would like a specialist to consider other, alternative therapies.    ED Prescriptions    Medication Sig Dispense Auth. Provider   levofloxacin (LEVAQUIN)  500 MG tablet Take 1 tablet (500 mg total) by mouth daily. 7 tablet Elvina Sidle, MD     Controlled Substance Prescriptions [place] Controlled Substance Registry consulted? Not Applicable   Elvina Sidle, MD 01/28/19 1451    Elvina Sidle, MD  01/28/19 1508  

## 2019-01-30 LAB — URINE CULTURE: Culture: 10000 — AB

## 2019-03-31 MED FILL — ?AMLODIPINE BESYLATE 5MG TA: 5 | 30 days supply | Qty: 30 | Fill #0

## 2019-08-19 ENCOUNTER — Ambulatory Visit: Payer: Self-pay | Admitting: Podiatry

## 2019-08-27 ENCOUNTER — Ambulatory Visit (INDEPENDENT_AMBULATORY_CARE_PROVIDER_SITE_OTHER): Payer: Medicare Other

## 2019-08-27 ENCOUNTER — Ambulatory Visit (INDEPENDENT_AMBULATORY_CARE_PROVIDER_SITE_OTHER): Payer: Medicare Other | Admitting: Podiatry

## 2019-08-27 ENCOUNTER — Other Ambulatory Visit: Payer: Self-pay | Admitting: Podiatry

## 2019-08-27 ENCOUNTER — Other Ambulatory Visit: Payer: Self-pay

## 2019-08-27 ENCOUNTER — Encounter: Payer: Self-pay | Admitting: Podiatry

## 2019-08-27 VITALS — Ht 75.0 in | Wt >= 6400 oz

## 2019-08-27 DIAGNOSIS — M779 Enthesopathy, unspecified: Secondary | ICD-10-CM

## 2019-08-27 DIAGNOSIS — M25572 Pain in left ankle and joints of left foot: Secondary | ICD-10-CM | POA: Diagnosis not present

## 2019-08-27 DIAGNOSIS — M79674 Pain in right toe(s): Secondary | ICD-10-CM

## 2019-08-27 DIAGNOSIS — M722 Plantar fascial fibromatosis: Secondary | ICD-10-CM

## 2019-08-27 DIAGNOSIS — M79672 Pain in left foot: Secondary | ICD-10-CM

## 2019-08-27 DIAGNOSIS — B351 Tinea unguium: Secondary | ICD-10-CM

## 2019-08-27 DIAGNOSIS — M79675 Pain in left toe(s): Secondary | ICD-10-CM

## 2019-08-27 NOTE — Patient Instructions (Signed)

## 2019-08-30 NOTE — Progress Notes (Signed)
Subjective:   Patient ID: Cody [name]., male   DOB: 58 y.o.   MRN: [id number]   HPI Patient states has had a lot of pain in the left heel that he has tried different activities with without relief and states that he is getting worse over the last few months and also has chronic nail disease of all nails that are very thickened he cannot take care of and patient is obese which is complicating factor.  Patient also complains of chronic ankle pain left and severe obesity   ROS      Objective:  Physical Exam  Neurovascular status was found to be currently intact with patient having flatfoot deformity bilateral and exquisite discomfort plantar aspect left heel thick yellow brittle nailbeds that he cannot take care of himself and ankle pain lateral left which appears to be more due to the flatfoot deformity     Assessment:  Severe obesity causing complications in gait with plantar fascial inflammation and mycotic infected toenails 1-5 both feet that he cannot take care of with ankle pain also noted left     Plan:  H&P reviewed all conditions and obesity is relationship to all of them.  Today I went ahead and for the left I did do sterile prep and injected the fascia 3 mg Kenalog 5 mg Xylocaine and went ahead and debrided nailbeds 1-5 both feet with no iatrogenic bleeding and discussed the ankle pain and do not recommend current treatment with the hope he will start walking differently and it will make a difference.  X-rays indicate that there is spur formation no indication stress fracture arthritis or other bone pathology

## 2019-09-06 ENCOUNTER — Telehealth: Payer: Self-pay | Admitting: Gastroenterology

## 2019-09-06 ENCOUNTER — Encounter: Payer: Self-pay | Admitting: Internal Medicine

## 2019-09-06 NOTE — Telephone Encounter (Signed)
Pt left vm to schedule from a Referral

## 2019-09-06 NOTE — Telephone Encounter (Signed)
LVM returning patients call to schedule colonoscopy.  Asked him to call the office back to schedule.  Thanks [employer]

## 2019-09-07 ENCOUNTER — Telehealth: Payer: Self-pay | Admitting: Gastroenterology

## 2019-09-07 NOTE — Telephone Encounter (Signed)
Patient wanted to schedule for 2021.  Informed him that we are not scheduling for January yet due to insurance will be changing.  Will call him back after the holidays to schedule at [facility].  Thanks [employer]

## 2019-09-07 NOTE — Telephone Encounter (Signed)
Patient called to schedule a colonoscopy . °

## 2019-09-14 IMAGING — CR DG ANKLE 2V *L*
2 series · 2 of 2 positions shown · non-contrast
Comparison: None.

CLINICAL DATA: History of ankle fusion

EXAM:
LEFT ANKLE - 2 VIEW

[t ankle joint lat left]
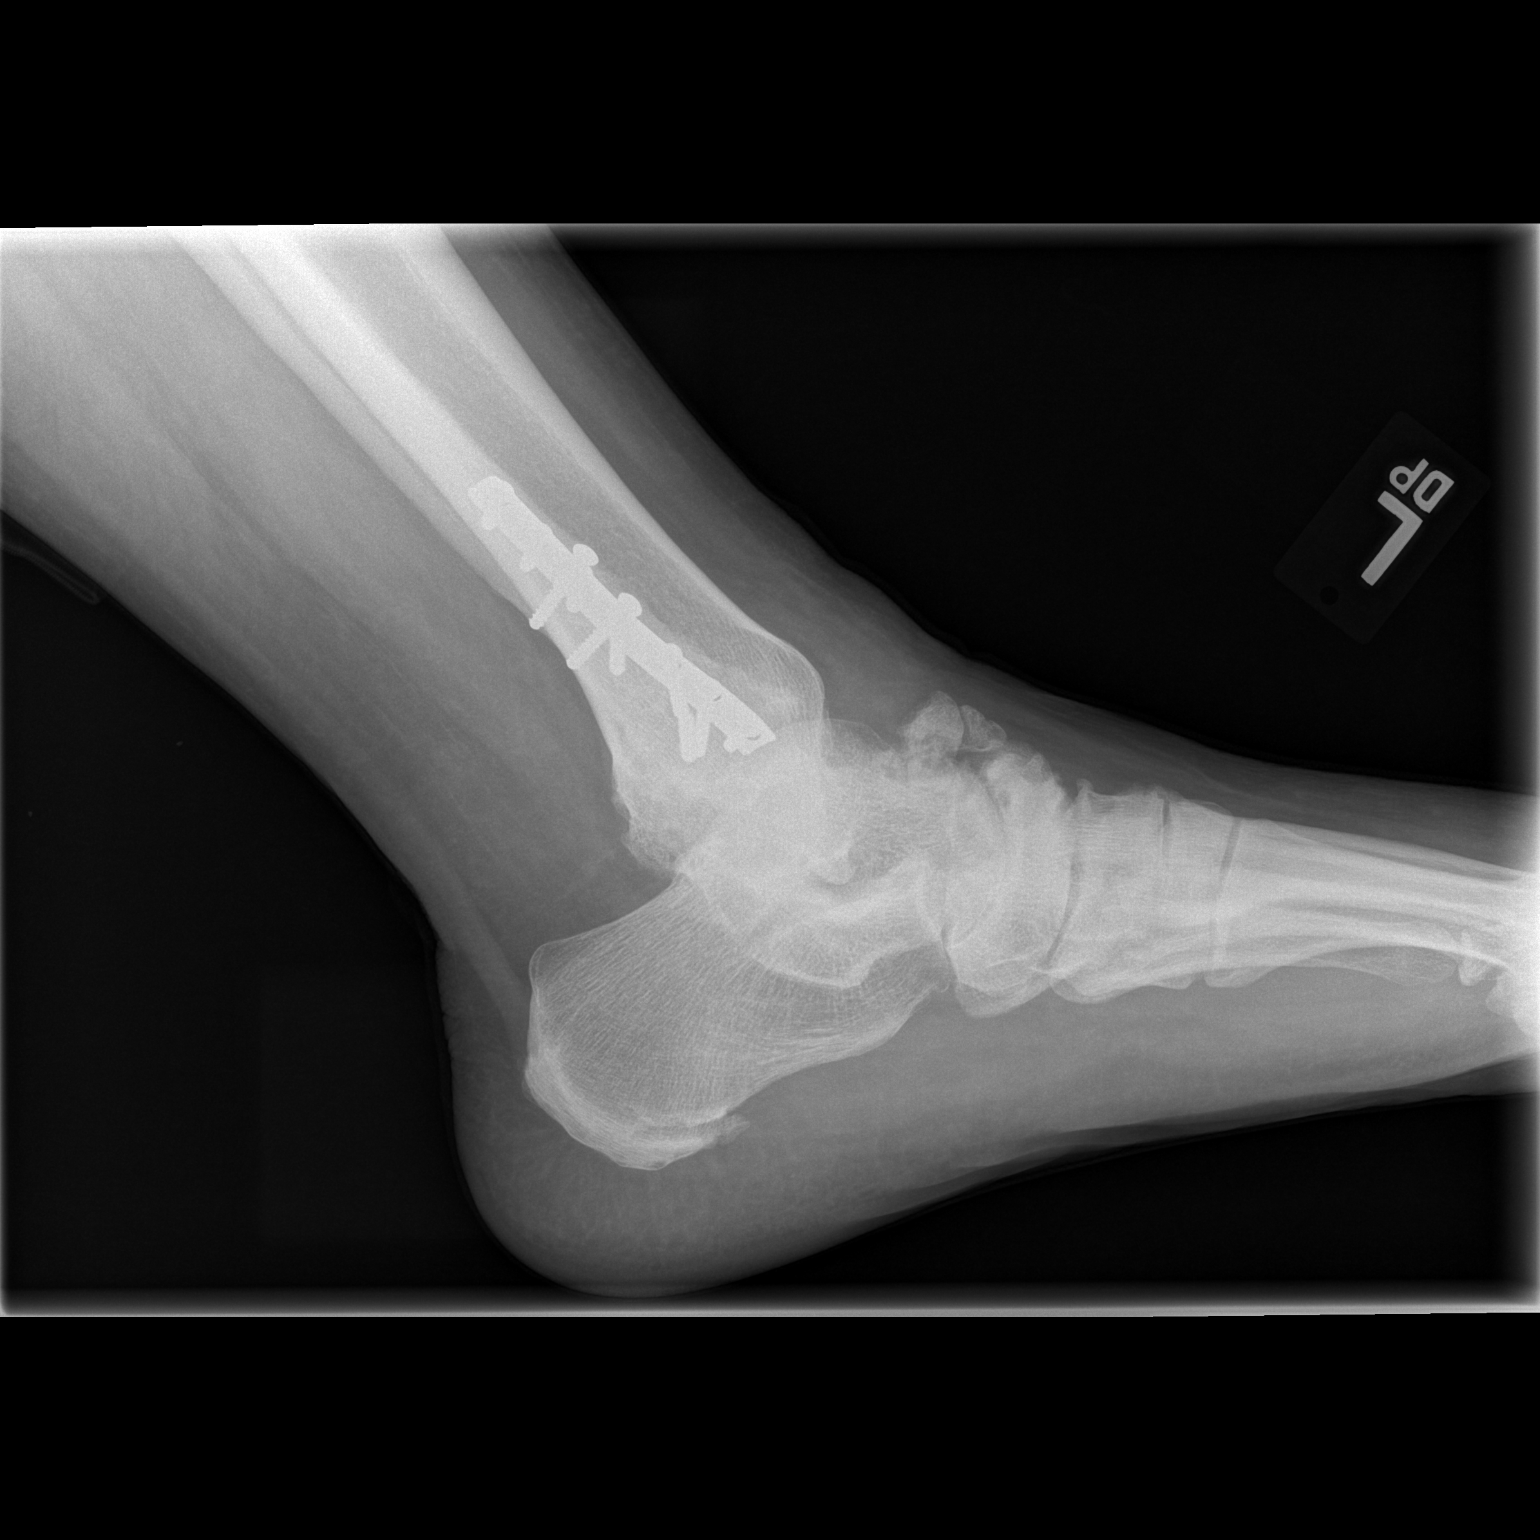

[t ankle joint ap left]
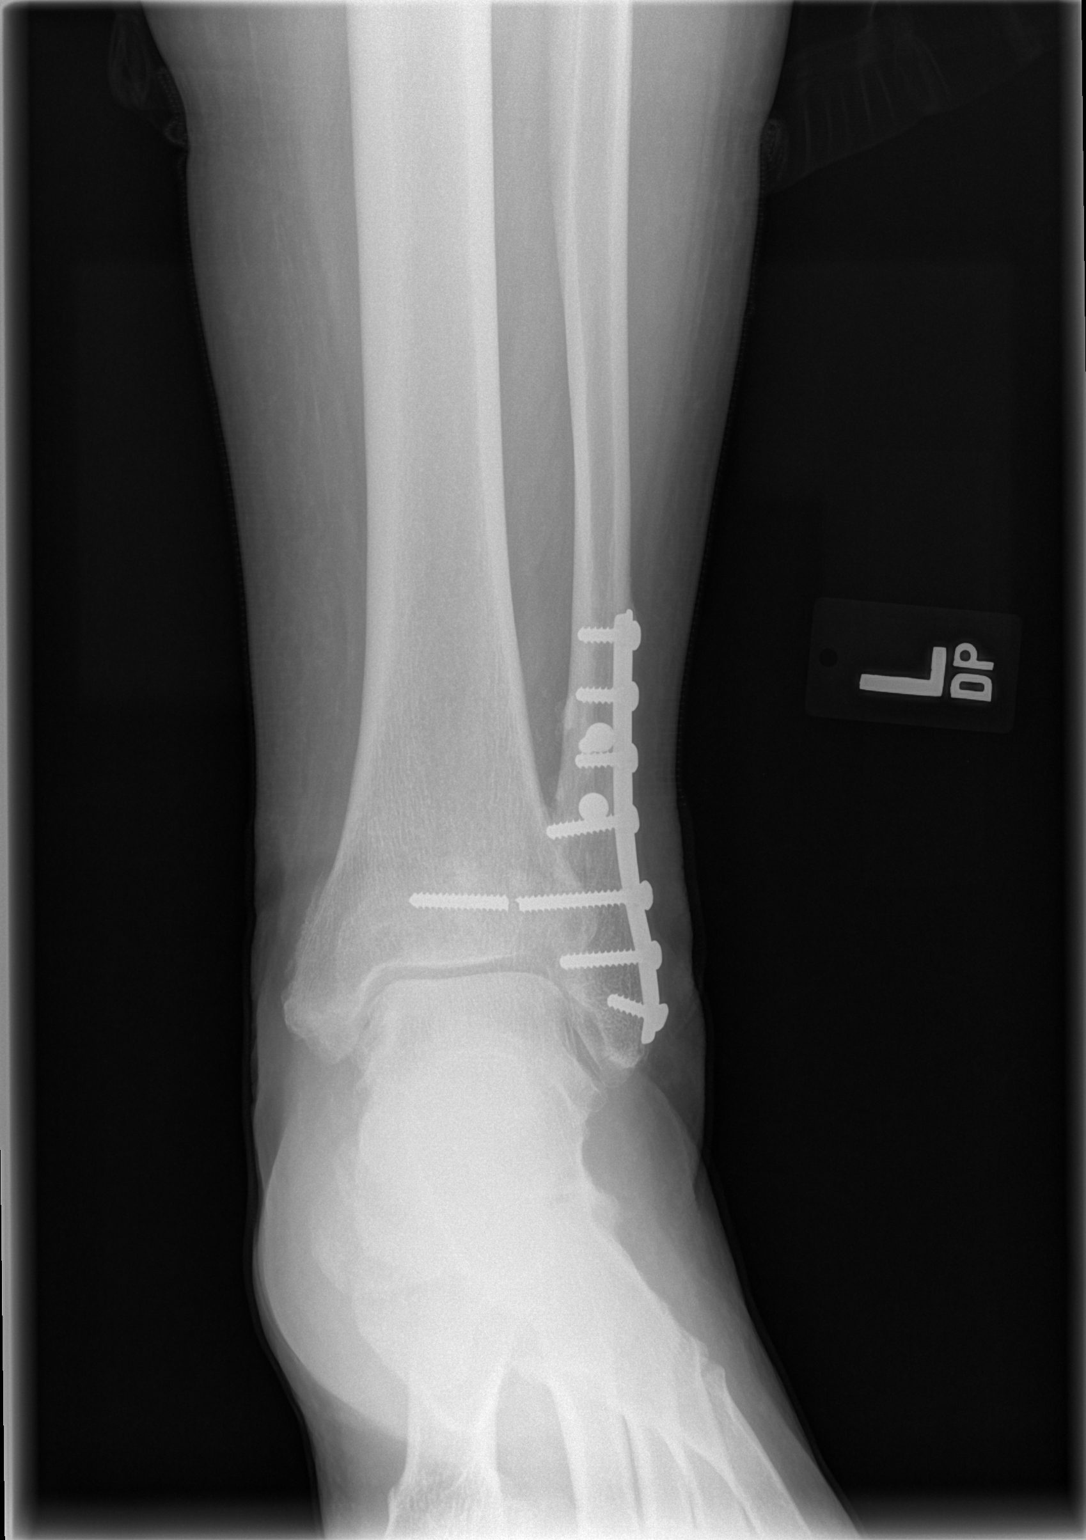

[2 of 2 positions shown; findings below may reference images not displayed]

FINDINGS: Prominent spurring or old fracture deformities of the dorsal
anterior talus and navicular bone. Moderate plantar calcaneal spur.
Surgical plate and multiple screw fixation of the distal fibula.
Single fixating screw across the distal tibia and fibula with
fracture through the fixating screw. Arthritis at the medial aspect
of the joint. No acute osseous abnormality
IMPRESSION: Surgical plate and screw fixation of the distal fibula. There is a
fracture through the screw that fixate the distal tibia and fibula.
There is no acute osseous abnormality.

## 2019-09-16 ENCOUNTER — Encounter: Payer: Self-pay | Admitting: *Deleted

## 2019-09-29 ENCOUNTER — Telehealth: Payer: Self-pay

## 2019-09-29 NOTE — Telephone Encounter (Signed)
Returned patients phone call in regards to scheduling his colonoscopy for him at the end of January on a Friday.  LVM informing him that we are not currently scheduling for January-but I will make a reminder to call him after the holidays to schedule.  Thanks [employer]

## 2019-11-02 ENCOUNTER — Telehealth: Payer: Self-pay

## 2019-11-02 NOTE — Telephone Encounter (Signed)
Discussed scheduling colonoscopy with patient, however he does not have anyone to accompany him to his colonoscopy.  Instruction packet will be mailed to patient so when he can arrange transportation he can call us back to schedule.  Thanks [employer]

## 2020-01-08 ENCOUNTER — Ambulatory Visit: Payer: Medicare Other

## 2020-06-30 ENCOUNTER — Encounter: Payer: Self-pay | Admitting: Orthopaedic Surgery

## 2020-06-30 ENCOUNTER — Ambulatory Visit (INDEPENDENT_AMBULATORY_CARE_PROVIDER_SITE_OTHER): Payer: Medicare Other | Admitting: Orthopaedic Surgery

## 2020-06-30 VITALS — Ht 73.0 in | Wt >= 6400 oz

## 2020-06-30 DIAGNOSIS — M12572 Traumatic arthropathy, left ankle and foot: Secondary | ICD-10-CM

## 2020-06-30 DIAGNOSIS — M25572 Pain in left ankle and joints of left foot: Secondary | ICD-10-CM

## 2020-06-30 DIAGNOSIS — M76822 Posterior tibial tendinitis, left leg: Secondary | ICD-10-CM

## 2020-06-30 DIAGNOSIS — M7672 Peroneal tendinitis, left leg: Secondary | ICD-10-CM | POA: Diagnosis not present

## 2020-06-30 DIAGNOSIS — M7741 Metatarsalgia, right foot: Secondary | ICD-10-CM

## 2020-06-30 NOTE — Progress Notes (Signed)
Office Visit Note   Patient: Cody [name].           Date of Birth: [birth year]           MRN: [id number] Visit Date: 06/30/2020              Requested by: Virl Cagey, MD [address] [place],  [place] [address] PCP: Virl Cagey, MD   Assessment & Plan: Visit Diagnoses:  1. Pain in left ankle and joints of left foot   2. Traumatic arthritis of left ankle   3. Posterior tibial tendinitis, left leg   4. Peroneal tendinitis of lower leg, left   5. Metatarsalgia, right foot     Plan: Impression is left ankle posttraumatic arthritis, posterior tibial and peroneal tendinitis and metatarsalgia.  We will place the patient in a cam walker weightbearing as tolerated to try and settle things down for the next few weeks.  Follow up in 4 weeks for recheck.  Follow-Up Instructions: Return in about 4 weeks (around 07/28/2020).   Orders:  Orders Placed This Encounter  Procedures  . Ambulatory referral to Podiatry   No orders of the defined types were placed in this encounter.     Procedures: No procedures performed   Clinical Data: No additional findings.   Subjective: Chief Complaint  Patient presents with  . Left Ankle - Pain    HPI patient is a 59 year old gentleman who comes in today with chronic left ankle pain.  He is status post ORIF left ankle over 30 years ago.  The pain he has to the left ankle is to the entire aspect as well as the entire aspect of the foot.  He has pain that is worse with exercising as well as with standing such as when he gardens or washes his car.  He occasionally has night pain as well.  He does not take any pain medication for this.  He feels that he gets a burning sensation that goes all the way to the bone.  He does ambulate with a cane.  He has previously been seen by Dr. Dellia Nims and Dr. Buzzy Han.  Previous left ankle cortisone injection 2 years ago provided only 3 weeks relief.  It was noted by Dr. Audrie Lia note that he suggested a left  ankle fusion.  Review of Systems as detailed in HPI.  All others reviewed and are negative.   Objective: Vital Signs: Ht 6\' 1"  (1.854 m)   Wt (!) 555 lb (251.7 kg)   BMI 73.22 kg/m   Physical Exam well-developed well-nourished gentleman in no acute distress.  Alert and oriented x3.  Exogenous obesity  Ortho Exam left ankle exam shows pes planus.  Moderate tenderness along the peroneal and posterior tibial tendons.  Moderate tenderness along the dorsum of the foot along the metatarsals.  Limited range of motion.  He is neurovascularly intact distally.  Specialty Comments:  No specialty comments available.  Imaging: No new imaging   PMFS History: Patient Active Problem List   Diagnosis Date Noted  . Posterior tibial tendinitis, left leg 01/05/2018  . Traumatic arthritis of left ankle 12/15/2017  . Hallux rigidus, left foot 12/15/2017  . Achilles tendon contracture, left 12/15/2017  . Acquired pes planus 11/21/2017  . Lower extremity edema 03/30/2017  . Hematuria 03/30/2017  . Prediabetes 12/31/2016  . Dyspnea on exertion 12/31/2016  . Plantar fasciitis 01/01/2016  . Osteoarthritis of ankle and foot 01/01/2016  . Plantar fasciitis, bilateral 10/20/2015  .  Morbid obesity (HCC) 10/20/2015  . Essential hypertension 10/20/2015   Past Medical History:  Diagnosis Date  . Hypertension   . Morbid obesity (HCC)     Family History  Problem Relation Age of Onset  . Cancer Mother   . Aneurysm Father     Past Surgical History:  Procedure Laterality Date  . ANKLE FUSION     left ankle 1980'S  . FOOT BONE EXCISION     Social History   Occupational History  . Not on file  Tobacco Use  . Smoking status: Current Some Day Smoker  . Smokeless tobacco: Never Used  Vaping Use  . Vaping Use: Former  Substance and Sexual Activity  . Alcohol use: Yes    Comment: occasionally  . Drug use: No  . Sexual activity: Not on file

## 2022-06-07 DIAGNOSIS — H43393 Other vitreous opacities, bilateral: Secondary | ICD-10-CM | POA: Diagnosis not present

## 2022-06-21 ENCOUNTER — Ambulatory Visit: Payer: Medicare Other | Admitting: Nurse Practitioner

## 2022-07-03 ENCOUNTER — Ambulatory Visit: Payer: Medicare Other | Admitting: Nurse Practitioner

## 2022-07-24 ENCOUNTER — Ambulatory Visit (INDEPENDENT_AMBULATORY_CARE_PROVIDER_SITE_OTHER): Payer: Medicare Other | Admitting: Nurse Practitioner

## 2022-07-24 ENCOUNTER — Encounter: Payer: Self-pay | Admitting: Nurse Practitioner

## 2022-07-24 VITALS — BP 139/79 | HR 77 | Ht 75.0 in | Wt >= 6400 oz

## 2022-07-24 DIAGNOSIS — Z8781 Personal history of (healed) traumatic fracture: Secondary | ICD-10-CM

## 2022-07-24 DIAGNOSIS — G8929 Other chronic pain: Secondary | ICD-10-CM

## 2022-07-24 DIAGNOSIS — Z7689 Persons encountering health services in other specified circumstances: Secondary | ICD-10-CM

## 2022-07-24 DIAGNOSIS — M25572 Pain in left ankle and joints of left foot: Secondary | ICD-10-CM

## 2022-07-24 DIAGNOSIS — N529 Male erectile dysfunction, unspecified: Secondary | ICD-10-CM

## 2022-07-24 DIAGNOSIS — B351 Tinea unguium: Secondary | ICD-10-CM | POA: Diagnosis not present

## 2022-07-24 DIAGNOSIS — J4520 Mild intermittent asthma, uncomplicated: Secondary | ICD-10-CM | POA: Diagnosis not present

## 2022-07-24 DIAGNOSIS — M25561 Pain in right knee: Secondary | ICD-10-CM

## 2022-07-24 DIAGNOSIS — L209 Atopic dermatitis, unspecified: Secondary | ICD-10-CM | POA: Diagnosis not present

## 2022-07-24 DIAGNOSIS — M19072 Primary osteoarthritis, left ankle and foot: Secondary | ICD-10-CM

## 2022-07-24 DIAGNOSIS — R600 Localized edema: Secondary | ICD-10-CM | POA: Diagnosis not present

## 2022-07-24 DIAGNOSIS — I10 Essential (primary) hypertension: Secondary | ICD-10-CM

## 2022-07-24 MED ORDER — TRIAMCINOLONE ACETONIDE 0.1 % EX CREA: 1.0000 | TOPICAL_CREAM | Freq: Two times a day (BID) | CUTANEOUS | 1 refills | Status: DC

## 2022-07-24 MED ORDER — ALBUTEROL SULFATE HFA 108 (90 BASE) MCG/ACT IN AERS: 2.0000 | INHALATION_SPRAY | Freq: Four times a day (QID) | RESPIRATORY_TRACT | 5 refills | Status: DC | PRN

## 2022-07-24 MED ORDER — SILDENAFIL CITRATE 100 MG PO TABS: 100.0000 mg | ORAL_TABLET | Freq: Every day | ORAL | 0 refills | Status: DC | PRN

## 2022-07-24 MED ORDER — FUROSEMIDE 40 MG PO TABS: 40.0000 mg | ORAL_TABLET | Freq: Every day | ORAL | 1 refills | Status: DC

## 2022-07-24 NOTE — Progress Notes (Signed)
New Patient Office Visit  Subjective    Patient ID: Cody Fohl., male    DOB: 11-Jul-1961  Age: 61 y.o. MRN: [id number]  CC:  Chief Complaint  Patient presents with   New Patient (Initial Visit)    HPI Cody So. presents to establish care Patient coming from different primary care office  -need to get records, labs, cologuard from [facility]. In [place]  -chronic pain right knee and left ankle.  -right knee pain is severe  -needs referral to podiatry for nail care and left ankle pain  --had refrigerator fall onto his left ankle. Did have x-ray done and screw that was placed was broken inside the ankle.   Outpatient Encounter Medications as of 07/24/2022  Medication Sig   albuterol (VENTOLIN HFA) 108 (90 Base) MCG/ACT inhaler Inhale 2 puffs into the lungs every 6 (six) hours as needed for wheezing or shortness of breath.   aspirin EC 81 MG tablet Take 81 mg by mouth daily.   Multiple Vitamins-Minerals (CENTRUM SILVER PO) Take 1 tablet by mouth daily.   sildenafil (VIAGRA) 100 MG tablet Take 1 tablet (100 mg total) by mouth daily as needed for erectile dysfunction.   [DISCONTINUED] amLODipine (NORVASC) 5 MG tablet Take 1 tablet (5 mg total) by mouth daily.   [DISCONTINUED] furosemide (LASIX) 40 MG tablet Take 40 mg by mouth daily.   [DISCONTINUED] levofloxacin (LEVAQUIN) 500 MG tablet Take 1 tablet (500 mg total) by mouth daily.   furosemide (LASIX) 40 MG tablet Take 1 tablet (40 mg total) by mouth daily.   triamcinolone cream (KENALOG) 0.1 % Apply 1 Application topically 2 (two) times daily.   [DISCONTINUED] triamcinolone cream (KENALOG) 0.1 % Apply 1 application topically 2 (two) times daily. (Patient not taking: Reported on 07/24/2022)   No facility-administered encounter medications on file as of 07/24/2022.    Past Medical History:  Diagnosis Date   Hypertension    Morbid obesity ([facility])     Past Surgical History:  Procedure  Laterality Date   ANKLE FUSION     left ankle 1980'S   FOOT BONE EXCISION      Family History  Problem Relation Age of Onset   Cancer Mother    Aneurysm Father     Social History   Socioeconomic History   Marital status: Single    Spouse name: Not on file   Number of children: Not on file   Years of education: Not on file   Highest education level: Not on file  Occupational History   Not on file  Tobacco Use   Smoking status: Some Days   Smokeless tobacco: Never  Vaping Use   Vaping Use: Former  Substance and Sexual Activity   Alcohol use: Yes    Comment: occasionally   Drug use: No   Sexual activity: Yes  Other Topics Concern   Not on file  Social History Narrative   Not on file   Social Determinants of Health   Financial Resource Strain: Not on file  Food Insecurity: Not on file  Transportation Needs: Not on file  Physical Activity: Not on file  Stress: Not on file  Social Connections: Not on file  Intimate Partner Violence: Not on file    Review of Systems  Constitutional:  Negative for chills, fever and malaise/fatigue.  HENT:  Negative for congestion, sinus pain and sore throat.   Eyes: Negative.   Respiratory:  Negative for cough, shortness of  breath and wheezing.   Cardiovascular:  Negative for chest pain, palpitations and leg swelling.  Gastrointestinal:  Negative for constipation, diarrhea, nausea and vomiting.  Genitourinary: Negative.   Musculoskeletal:  Positive for joint pain. Negative for myalgias.       Right knee and right ankle pain   Skin: Negative.   Neurological:  Negative for dizziness and headaches.  Endo/Heme/Allergies:  Does not bruise/bleed easily.  Psychiatric/Behavioral:  Negative for depression. The patient is not nervous/anxious.         Objective    Today's Vitals   07/24/22 1501  BP: 139/79  Pulse: 77  SpO2: 95%  Weight: (Abnormal) 556 lb (252.2 kg)  Height: 6\' 3"  (1.905 m)   Body mass index is 69.5 kg/m.    Physical Exam Vitals and nursing note reviewed.  Constitutional:      Appearance: Normal appearance. He is well-developed.  HENT:     Head: Normocephalic and atraumatic.     Nose: Nose normal.     Mouth/Throat:     Mouth: Mucous membranes are moist.     Pharynx: Oropharynx is clear.  Eyes:     Extraocular Movements: Extraocular movements intact.     Conjunctiva/sclera: Conjunctivae normal.     Pupils: Pupils are equal, round, and reactive to light.  Cardiovascular:     Rate and Rhythm: Normal rate and regular rhythm.     Pulses: Normal pulses.     Heart sounds: Normal heart sounds.  Pulmonary:     Effort: Pulmonary effort is normal.     Breath sounds: Normal breath sounds.  Abdominal:     Palpations: Abdomen is soft.  Musculoskeletal:        General: Normal range of motion.     Cervical back: Normal range of motion and neck supple.     Comments: Right knee pain with swelling. Mild swelling in right knee noted. Crepitus can be felt with flexion and extension of right leg.   Lymphadenopathy:     Cervical: No cervical adenopathy.  Skin:    General: Skin is warm and dry.     Capillary Refill: Capillary refill takes less than 2 seconds.  Neurological:     General: No focal deficit present.     Mental Status: He is alert and oriented to person, place, and time.  Psychiatric:        Mood and Affect: Mood normal.        Behavior: Behavior normal.        Thought Content: Thought content normal.        Judgment: Judgment normal.        Assessment & Plan:  1. Chronic pain of right knee Patient with chronic and severe right knee pain which is getting worse.  Advised him to rest, ice, and elevate knee when possible.  Use light compression to prevent further injury.  Refer to orthopedics for further evaluation and treatment. - Ambulatory referral to Orthopedic Surgery  2. Chronic pain of left ankle Patient also with history of left ankle fracture and chronic pain left ankle.   Referral has been made to podiatry for further evaluation and treatment.  3. History of ankle fracture Patient also with history of left ankle fracture and chronic pain left ankle.  Referral has been made to podiatry for further evaluation and treatment. - Ambulatory referral to Podiatry  4. Osteoarthritis of left ankle and foot Patient also with history of left ankle fracture and chronic pain left ankle.  Referral  has been made to podiatry for further evaluation and treatment. - Ambulatory referral to Podiatry  5. Onychomycosis Use Kenalog cream 0.1% twice daily until nail bed.  Refer to podiatry. - Ambulatory referral to Podiatry  6. Atopic dermatitis, unspecified type Milligrams to be applied to affected areas twice daily as needed. - triamcinolone cream (KENALOG) 0.1 %; Apply 1 Application topically 2 (two) times daily.  Dispense: 453 g; Refill: 1  7. Lower extremity edema Continue furosemide 40 mg daily. - furosemide (LASIX) 40 MG tablet; Take 1 tablet (40 mg total) by mouth daily.  Dispense: 90 tablet; Refill: 1  8. Essential hypertension Stable.  Continue Wellbutrin as prescribed.  9. Mild intermittent asthma without complication Use albuterol rescue inhaler as needed and as prescribed. - albuterol (VENTOLIN HFA) 108 (90 Base) MCG/ACT inhaler; Inhale 2 puffs into the lungs every 6 (six) hours as needed for wheezing or shortness of breath.  Dispense: 18 g; Refill: 5  10. Erectile dysfunction, unspecified erectile dysfunction type He takes sildenafil as needed. - sildenafil (VIAGRA) 100 MG tablet; Take 1 tablet (100 mg total) by mouth daily as needed for erectile dysfunction.  Dispense: 10 tablet; Refill: 0  11. Encounter to establish care Appointment today to establish new primary care provider. Will get records from previous pcp to review and update patient chart.     Problem List Items Addressed This Visit       Cardiovascular and Mediastinum   Essential hypertension    Relevant Medications   furosemide (LASIX) 40 MG tablet   sildenafil (VIAGRA) 100 MG tablet     Respiratory   Mild intermittent asthma without complication   Relevant Medications   albuterol (VENTOLIN HFA) 108 (90 Base) MCG/ACT inhaler     Musculoskeletal and Integument   Osteoarthritis of ankle and foot   Relevant Orders   Ambulatory referral to Podiatry   Onychomycosis   Relevant Orders   Ambulatory referral to Podiatry   Atopic dermatitis   Relevant Medications   triamcinolone cream (KENALOG) 0.1 %     Other   Lower extremity edema   Relevant Medications   furosemide (LASIX) 40 MG tablet   Chronic pain of right knee - Primary   Relevant Orders   Ambulatory referral to Orthopedic Surgery   Chronic pain of left ankle   History of ankle fracture   Relevant Orders   Ambulatory referral to Podiatry   Other Visit Diagnoses     Erectile dysfunction, unspecified erectile dysfunction type       Relevant Medications   sildenafil (VIAGRA) 100 MG tablet   Encounter to establish care           Return in about 6 weeks (around 09/04/2022) for blood pressure - please get records, labs, and most recent phyical from [facility] on [address].   Carlean Jews, NP

## 2022-07-30 ENCOUNTER — Ambulatory Visit (INDEPENDENT_AMBULATORY_CARE_PROVIDER_SITE_OTHER): Payer: Self-pay | Admitting: Podiatry

## 2022-07-30 DIAGNOSIS — Z91199 Patient's noncompliance with other medical treatment and regimen due to unspecified reason: Secondary | ICD-10-CM

## 2022-07-30 NOTE — Progress Notes (Signed)
No show

## 2022-08-09 ENCOUNTER — Ambulatory Visit (INDEPENDENT_AMBULATORY_CARE_PROVIDER_SITE_OTHER): Payer: Medicare Other

## 2022-08-09 ENCOUNTER — Ambulatory Visit (INDEPENDENT_AMBULATORY_CARE_PROVIDER_SITE_OTHER): Payer: Medicare Other | Admitting: Sports Medicine

## 2022-08-09 ENCOUNTER — Encounter: Payer: Self-pay | Admitting: Sports Medicine

## 2022-08-09 VITALS — BP 125/79 | HR 99 | Ht 73.0 in | Wt >= 6400 oz

## 2022-08-09 DIAGNOSIS — M25572 Pain in left ankle and joints of left foot: Secondary | ICD-10-CM

## 2022-08-09 DIAGNOSIS — Z6841 Body Mass Index (BMI) 40.0 and over, adult: Secondary | ICD-10-CM

## 2022-08-09 DIAGNOSIS — M2142 Flat foot [pes planus] (acquired), left foot: Secondary | ICD-10-CM

## 2022-08-09 DIAGNOSIS — M2141 Flat foot [pes planus] (acquired), right foot: Secondary | ICD-10-CM | POA: Diagnosis not present

## 2022-08-09 DIAGNOSIS — G8929 Other chronic pain: Secondary | ICD-10-CM

## 2022-08-09 DIAGNOSIS — M25561 Pain in right knee: Secondary | ICD-10-CM

## 2022-08-09 NOTE — Progress Notes (Addendum)
Cody Cody [name]. - 61 y.o. male MRN [id number]  Date of birth: 08/27/1961  Office Visit Note: Visit Date: 08/09/2022 PCP: Cody Freshwater, NP Referred by: Cody Freshwater, NP  Subjective: Chief Complaint  Patient presents with   Right Knee - Pain   HPI: Cody Cody [name]. is a pleasant 61 y.o. male who presents today for chronic right knee and left ankle pain.   Right knee pain - he has had a longer history of right knee pain, although recently been exacerbated over the last 3 months.  Denies any specific injury.  The pain has been waking up at night.  Gets pain over the medial aspect of the knee, sometimes in the back.  He has been using a cane to ambulate.  Denies any popping, clicking or locking of the knee.  He does like to be holistic and Cody [name] is taking supplements as well as herbal tea.  Does note he has gained some weight recently as well.  Left ankle pain - Long history of pain with this.  He had a traumatic injury playing football back in the 1980s which required status post ORIF to the left ankle.  Saw Dr. Erlinda [name] for this in 2021 - placed in CAM walker to settle down and sent to podiatry.  Over the last 2 years he has worn the cam walker on and off when this flares up.  Pain is over the anterior aspect of the ankle, get some crepitus in that region.  Also some swelling in both legs, but more Cody [name] over the left lateral ankle.  No redness, no fever or chills.  Pertinent ROS were reviewed with the patient and found to be negative unless otherwise specified above in HPI.   Assessment & Plan: Visit Diagnoses:  1. Chronic pain of right knee   2. Pain in left ankle and joints of left foot   3. Flat feet, bilateral   4. Class 3 severe obesity with body mass index (BMI) greater than or equal to 70 in adult, unspecified obesity type, unspecified whether serious comorbidity present [facility])    Plan: Had a discussion with Cody Cody [name] today regarding his right knee pain which does show rather  advanced medial joint space collapse.  He has taken occasional anti-inflammatories without much relief.  We did discuss holistic, homeopathic treatment options such as foods and supplements that are known to help with anti-inflammatory properties.  Did discuss a trial of meloxicam, he would like to hold off on this for now.  Okay for over-the-counter anti-inflammatories.  We did discuss options for healthy weight loss to help offload the knees--he will begin with aquatic therapy at his local [employer], also recommended stationary bicycle.  Injection therapy with corticosteroid could be considered at future visits, although he would like to hold off on this for now.  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  He does have a cortical break in one of the screws from prior fusion, I would like him to see Dr. Sharol [name] for a discussion if surgery would be an option for him.  He likely would need a midfoot fusion to help with his pain.  He also has rather significant pes planus - he is interested in custom orthotics to help support his arch, we will send him to the sports medicine center for custom orthotics. May benefit from a metatarsal pad bilaterally as well.  Follow-up: Return for with Dr. Sharol [name] to discuss  ankle pain with hx of ORIF, tibiotalar post-traumatic OA.   Meds & Orders: No orders of the defined types were placed in this encounter.   Orders Placed This Encounter  Procedures   XR KNEE 3 VIEW RIGHT   XR Ankle Complete Left   AMB referral to sports medicine     Procedures: No procedures performed      Clinical History: No specialty comments available.  He reports that he has been smoking. He has never used smokeless tobacco. No results for input(s): "HGBA1C", "LABURIC" in the last 8760 hours.  Objective:   Vital Signs: BP 125/79   Pulse 99   Ht 6\' 1"  (1.854 m)   Wt (!) 572 lb 12.8 oz (259.8 kg)   BMI 75.57 kg/m   Physical Exam  Gen:  Well-appearing, in no acute distress; non-toxic CV: Regular Rate. Well-perfused. Warm.  Resp: Breathing unlabored on room air; no wheezing. Psych: Fluid speech in conversation; appropriate affect; normal thought process Neuro: Sensation intact throughout. No gross coordination deficits.   Ortho Exam - Right knee: Examination of the right knee demonstrates no warmth or gross effusion.  There is some medial joint line TTP.  Range of motion from 0-125 degrees.  There is some pain with endrange knee flexion.  No varus or valgus instability.  There is some mild knee crepitus with knee flexion and extension.  5/5 strength with knee resisted flexion and extension.  There is some generalized soft tissue swelling of bilateral legs, although calves are soft and nontender bilaterally.  - Left ankle/foot: Evaluation demonstrates soft tissue swelling more Cody [name] in the lateral aspect of the ankle.  There is prior surgical incision that is well-healed without signs of erythema.  Patient has notable pes planus.  He has limited range of motion with dorsiflexion to only about 70 degrees before pain and bony block.  Positive TTP over the anterior medial joint space, anterior to the lateral malleoli.  He is able to perform bilateral heel raises. Posterior tibial tendon does appear to fire compared to the contralateral ankle.  Imaging:  *Independent review of both ankle and foot x-rays from 08/27/2019 were interepreted by myself. 2 views, mortise and lateral of the left ankle were visualized with prior ORIF. There does appear to the be the same cortical break in the screw that was seen on today's exam. Review of the foot AP and lateral demonstrates similar bony spurring and degenerative change at the talonavicular joint, although slightly less advanced in 2020. Pes planovalgus noted.   XR Ankle Complete Left  Result Date: 08/09/2022 3 views of the left ankle including AP, lateral and oblique were ordered and reviewed by  myself.  X-rays demonstrate previous ORIF with 9 screws.  There does appear to be a cortical break within the screw third from the bottom transects the lateral fibula across the anterior tibia.  Ankle mortise appears intact and uniform on both medial and lateral sides. There is severe talonavicular arthritis with significant anterior spurring, at least moderate-severe navicular cuneiform DJD.   Previous ORIF to the lateral malleolus with significant posttraumatic midfoot arthritis.  XR KNEE 3 VIEW RIGHT  Result Date: 08/09/2022 4 views of the right knee including bilateral AP, Lutricia Feil, right knee lateral and sunrise views were read and reviewed by myself.  X-rays demonstrate near bone-on-bone medial joint space DJD.  There is also moderate to severe patellofemoral arthritis with spurring of the inferior and lateral patella.  No acute fracture noted.  Past Medical/Family/Surgical/Social History: Medications & Allergies reviewed per EMR, new medications updated. Patient Active Problem List   Diagnosis Date Noted   Posterior tibial tendinitis, left leg 01/05/2018   Traumatic arthritis of left ankle 12/15/2017   Hallux rigidus, left foot 12/15/2017   Achilles tendon contracture, left 12/15/2017   Acquired pes planus 11/21/2017   Lower extremity edema 03/30/2017   Hematuria 03/30/2017   Prediabetes 12/31/2016   Dyspnea on exertion 12/31/2016   Plantar fasciitis 01/01/2016   Osteoarthritis of ankle and foot 01/01/2016   Plantar fasciitis, bilateral 10/20/2015   Morbid obesity ([place]) 10/20/2015   Essential hypertension 10/20/2015   Past Medical History:  Diagnosis Date   Hypertension    Morbid obesity ([place])    Family History  Problem Relation Age of Onset   Cancer Mother    Aneurysm Father    Past Surgical History:  Procedure Laterality Date   ANKLE FUSION     left ankle 1980'S   FOOT BONE EXCISION     Social History   Occupational History   Not on file  Tobacco Use    Smoking status: Some Days   Smokeless tobacco: Never  Vaping Use   Vaping Use: Former  Substance and Sexual Activity   Alcohol use: Yes    Comment: occasionally   Drug use: No   Sexual activity: Yes

## 2022-08-09 NOTE — Patient Instructions (Signed)
Managing knee osteoarthritis:  -2 best exercises are aquatic therapy, stationary bike or elliptical  -Weight control is important for the knees, and for 10% of total body weight, but even loss of 10 pounds may significantly help your pain.  1 pound of weight equals 3 pounds of pressure on the knees  -Supplements to help with arthritis: Turmeric, Boswellia, Pycnogenol, glucosamine sulfate-chondroitin  -Foods that have anti-inflammatory products such as fish, soy, omega-3's, green tea. -Make sure you are staying well-hydrated with water

## 2022-08-11 DIAGNOSIS — G8929 Other chronic pain: Secondary | ICD-10-CM | POA: Insufficient documentation

## 2022-08-11 DIAGNOSIS — L209 Atopic dermatitis, unspecified: Secondary | ICD-10-CM | POA: Insufficient documentation

## 2022-08-11 DIAGNOSIS — Z8781 Personal history of (healed) traumatic fracture: Secondary | ICD-10-CM | POA: Insufficient documentation

## 2022-08-11 DIAGNOSIS — B351 Tinea unguium: Secondary | ICD-10-CM | POA: Insufficient documentation

## 2022-08-11 DIAGNOSIS — J4520 Mild intermittent asthma, uncomplicated: Secondary | ICD-10-CM | POA: Insufficient documentation

## 2022-08-23 DIAGNOSIS — B351 Tinea unguium: Secondary | ICD-10-CM | POA: Diagnosis not present

## 2022-08-23 DIAGNOSIS — M792 Neuralgia and neuritis, unspecified: Secondary | ICD-10-CM | POA: Diagnosis not present

## 2022-08-26 ENCOUNTER — Ambulatory Visit: Payer: Medicare Other | Admitting: Orthopedic Surgery

## 2022-09-03 ENCOUNTER — Ambulatory Visit: Payer: Medicare Other | Admitting: Orthopedic Surgery

## 2022-09-10 NOTE — Progress Notes (Deleted)
Established patient visit   Patient: Cody [name].   DOB: 05/08/1961   61 y.o. Male  MRN: [id number] Visit Date: 09/11/2022   No chief complaint on file.  Subjective    HPI  Follow up  -hypertension  -now seeing orthopedics for chronic knee and ankle pain     Medications: Outpatient Medications Prior to Visit  Medication Sig   albuterol (VENTOLIN HFA) 108 (90 Base) MCG/ACT inhaler Inhale 2 puffs into the lungs every 6 (six) hours as needed for wheezing or shortness of breath.   aspirin EC 81 MG tablet Take 81 mg by mouth daily.   furosemide (LASIX) 40 MG tablet Take 1 tablet (40 mg total) by mouth daily.   Multiple Vitamins-Minerals (CENTRUM SILVER PO) Take 1 tablet by mouth daily.   sildenafil (VIAGRA) 100 MG tablet Take 1 tablet (100 mg total) by mouth daily as needed for erectile dysfunction.   triamcinolone cream (KENALOG) 0.1 % Apply 1 Application topically 2 (two) times daily.   No facility-administered medications prior to visit.    Review of Systems  {Labs (Optional):23779}   Objective    There were no vitals filed for this visit. There is no height or weight on file to calculate BMI.  BP Readings from Last 3 Encounters:  08/09/22 125/79  07/24/22 139/79  01/28/19 (Abnormal) 146/93    Wt Readings from Last 3 Encounters:  08/09/22 (Abnormal) 572 lb 12.8 oz (259.8 kg)  07/24/22 (Abnormal) 556 lb (252.2 kg)  06/30/20 (Abnormal) 555 lb (251.7 kg)    Physical Exam  ***  No results found for any visits on 09/11/22.  Assessment & Plan     Problem List Items Addressed This Visit   None    No follow-ups on file.         Ronnell Freshwater, NP  [facility] at [facility] [phone] (phone) [phone] (fax)  [place]

## 2022-09-11 ENCOUNTER — Ambulatory Visit: Payer: Medicare Other | Admitting: Nurse Practitioner

## 2022-09-16 ENCOUNTER — Telehealth: Payer: Self-pay | Admitting: Orthopaedic Surgery

## 2022-09-16 NOTE — Telephone Encounter (Signed)
FYI Received call from Hailey with [employer] she advised patient has medicaid and he is receiving bills. Hailey said the medicaid number is [id number]

## 2023-08-08 ENCOUNTER — Other Ambulatory Visit: Payer: Self-pay | Admitting: Nurse Practitioner

## 2023-08-08 DIAGNOSIS — Z1211 Encounter for screening for malignant neoplasm of colon: Secondary | ICD-10-CM

## 2023-08-08 DIAGNOSIS — Z1212 Encounter for screening for malignant neoplasm of rectum: Secondary | ICD-10-CM

## 2023-09-15 ENCOUNTER — Telehealth: Payer: Self-pay | Admitting: *Deleted

## 2023-09-15 NOTE — Telephone Encounter (Signed)
LVM for pt to call office to inquire about below and to schedule if he is till a patient at this location.

## 2023-09-15 NOTE — Telephone Encounter (Signed)
-----   Message from Melida Quitter sent at 09/09/2023  5:04 PM EST ----- From what I can see in the chart, it does not look like this patient is our patient anymore and has also not been seen anywhere else since the end of 2023.  Would you mind calling to confirm if he has established with a different PCP, and if not he needs an appointment ASAP. Thank you!

## 2023-09-18 DIAGNOSIS — Z1211 Encounter for screening for malignant neoplasm of colon: Secondary | ICD-10-CM | POA: Diagnosis not present

## 2023-09-18 DIAGNOSIS — Z1212 Encounter for screening for malignant neoplasm of rectum: Secondary | ICD-10-CM | POA: Diagnosis not present

## 2023-09-22 ENCOUNTER — Telehealth: Payer: Self-pay | Admitting: *Deleted

## 2023-09-22 NOTE — Telephone Encounter (Signed)
LVM for pt to call office to inform him of below.  

## 2023-09-22 NOTE — Telephone Encounter (Signed)
I don't see anything regarding sperm count.  I do see a scanned report from 2020 that includes a testosterone lab.  Maybe that's what he's referring to.

## 2023-09-22 NOTE — Telephone Encounter (Signed)
Pt calling to say that he sent his cologuard out Friday and wanted to check on the results.  Informed him that it would take a while and once the results came back he would contact him with the results.  He also was asking about a test to check his sperm count.  I told him that I did not see anything but that I would have provider review to see if there are any results for this.  He said it is probably 2-3 years ago.

## 2023-09-23 NOTE — Telephone Encounter (Signed)
Pt was informed of below.

## 2023-09-26 LAB — COLOGUARD: COLOGUARD: NEGATIVE

## 2023-10-15 ENCOUNTER — Encounter: Payer: 59 | Admitting: Family Medicine

## 2023-10-15 NOTE — Progress Notes (Unsigned)
   Annual physical  Subjective   Patient ID: Cody Butzer., male    DOB: 15-Sep-1961  Age: 62 y.o. MRN: [id number]  No chief complaint on file.  HPI Cody [name] is a 62 y.o. old male here  for annual exam.   Changes in his/her health in the last 12 months: {yes/no:63}  The patient currently works as a***/does not work***.  He is married/single/in a relationship and recently/not recently sexually active with ***male partner.  He does*** use tobacco***, drinks***days per***, does***use recreational drugs.  The patient eats a***diet.  He exercises***times per week doing***.  The patient does***have an advanced directive.  The patient declines/endorses concerns for impaired hearing.  The patient declines/endorses concerns for impaired vision.  The patient does/does not see a dentist regularly.  The patient does***have a family history of prostate cancer.  The patient does***have a family history of colon cancer.  The patient has the following chronic health issues that are monitored on a routine basis: ***  Severe obesity-why does not he have any labs from the past 5 years  Wants to talk about testosterone  HPI  Separate, acute concerns today: ***  The ASCVD Risk score (Arnett DK, et al., 2019) failed to calculate for the following reasons:   The systolic blood pressure is missing   Cannot find a previous HDL lab   Cannot find a previous total cholesterol lab  Health Maintenance Due  Topic Date Due   Medicare Annual Wellness (AWV)  Never done   HIV Screening  Never done   Hepatitis C Screening  Never done   Zoster Vaccines- Shingrix (1 of 2) Never done   COVID-19 Vaccine (1 - 2023-24 season) Never done      Objective:     There were no vitals taken for this visit. {Vitals History (Optional):23777}  Physical Exam   No results found for any visits on 10/15/23.      Assessment & Plan:   There are no diagnoses linked to this encounter.   No follow-ups on file.     Sandre Kitty, MD

## 2024-10-06 ENCOUNTER — Encounter: Payer: Self-pay | Admitting: Physician Assistant

## 2024-10-06 ENCOUNTER — Telehealth: Payer: Self-pay

## 2024-10-06 ENCOUNTER — Other Ambulatory Visit: Payer: Self-pay | Admitting: Physician Assistant

## 2024-10-06 ENCOUNTER — Other Ambulatory Visit: Payer: Self-pay

## 2024-10-06 DIAGNOSIS — J4520 Mild intermittent asthma, uncomplicated: Secondary | ICD-10-CM

## 2024-10-06 MED ORDER — ALBUTEROL SULFATE HFA 108 (90 BASE) MCG/ACT IN AERS
2.0000 | INHALATION_SPRAY | Freq: Four times a day (QID) | RESPIRATORY_TRACT | 5 refills | Status: AC | PRN
  Filled 2024-10-06: qty 18, 25d supply, fill #0

## 2024-10-06 NOTE — Progress Notes (Cosign Needed Addendum)
 Mgm [place]  S: 63 yo with obesity, HTN here with concerns regarding plantar fascitis and swelling.  Issues with plantar fascitis in both feet for years, over 9 years.  Typically managed by rolling cold can on foot, hasn't been able to do that here.  Doesn't like to take tylenol , ibuprofen, etc - prefers natural supplements.   Feels like he's had more lower extremity swelling recently.  Complains of cold like symptoms.  Asking for shoes.    O: Vitals:   10/06/24 1430  BP: (!) 142/85  Pulse: 86  SpO2: 94%  - note, his BP cuff actually borderline small (this was the largest cuff we had available)  General: No acute distress. Cardiovascular: Heart sounds show a regular rate, and rhythm. Lungs: Clear to auscultation bilaterally with good air movement. No rales, rhonchi or wheezes. Neurological: Alert and oriented 3. Moves all extremities 4. Cranial nerves II through XII grossly intact. Skin: Warm and dry. No rashes or lesions. Extremities: 1+ edema bilaterally  A/P: Plantar Fascitis: tennis ball.  Trial of voltaren .  Lower Extremity Edema: needs labs so we can better establish a cause and monitor lytes/renal function with any diuresis.  Undifferentiated at this point.  Needs renal function, urine to eval for proteinuria, hepatic function panel, and an echo.  He needs a PCP for these things and further management.  Will defer prescribing a diuretic at this point, will see about compression stockings.  Unable to get accurate weight.   URI: symptoms appear mild, follow and manage symptomatically.   Refilled asthma, diagnosis for this unclear, he notes smoking history, possible COPD?  Working on shoes.  Given reading glasses.  Needs PCP, ophthalmologist.    Cody Monte, MD  ------------------------------- Pt came to the shelter in September. He was living in the family home, with his name on the deed. His sister then sold the home. He was not given  any part of the sale.   He hopes to get a place before Christmas.   Pt has severe plantar fasciitis.   Pt was taking burdock root, cinnamon, ginger and cayenne pepper, milk thistle for water weight and plantar fasciitis. Turmeric for his joints. Not on his herbs due to situation.   Currently only taking Sea moss. He is out of Shilajit.   He uses a cane.  His shoes were given to him, size 15 which is his size, but he needs extra wide. They are very uncomfortable. He has used compression socks in the past, we will give him some.   He does not have prescription insoles.  He has ulcers years ago so does not wish to use oral NSAIDs, but will use Voltaren  cream.   Pt given a tennis ball as well.   He has a rod and 9 screws in his L ankle from playing football.   He used to smoke and says he has sleep apnea at times.   Denies drug and ETOH use.   Does not have a PCP, will need an Osteopath as he is interested in holistic medicine.   Today's Vitals   10/06/24 1430  BP: (!) 142/85  Pulse: 86  SpO2: 94%   There is no height or weight on file to calculate BMI.  He has some congestion and a small cough, did not sleep much last night due to coughing and nose blowing.   Lungs are clear, he has had rx for albuterol  in the past, is out. Will refill.   He  has LE edema and some inner R knee pain, says that his fluid level is up.   Last weight was 475, not recent. Our scales go up to 500 lbs, his wt today does not register.   L shoulder also gives him pain. If he can relax it, it won't hurt as much.   OK to use the Voltaren  on his shoulder.   We will work at getting him a PCP appt.  He reported a problem with vision, close up and far away. He was given 1.50 reading glasses, which helped. He will need an Eye doctor appt as well.    [name], PA-C 10/06/2024 2:28 PM

## 2024-10-06 NOTE — Progress Notes (Signed)
 See note  [name], PA-C 10/06/2024 2:49 PM'

## 2024-10-06 NOTE — Telephone Encounter (Signed)
 Copied from CRM [id number]. Topic: Appointments - Transfer of Care >> Oct 06, 2024  2:46 PM Antwanette L wrote: Pt is requesting to transfer FROM: Dr. Toribio Slain Pt is requesting to transfer TO: Sula Leavy Rode PA-C Reason for requested transfer: [facility] is closer to where the patient lives It is the responsibility of the team the patient would like to transfer to Renny Leavy Rode) to reach out to the patient if for any reason this transfer is not acceptable.

## 2024-10-07 ENCOUNTER — Encounter: Payer: Self-pay | Admitting: *Deleted

## 2024-10-07 NOTE — Congregational Nurse Program (Signed)
  Dept: [phone]   Congregational Nurse Program Note  Date of Encounter: 10/07/2024  Past Medical History: Past Medical History:  Diagnosis Date   Hypertension    Morbid obesity [facility])     Encounter Details:  Community Questionnaire - 10/07/24 1456       Questionnaire   Ask client: Do you give verbal consent for me to treat you today? N/A    Student Assistance N/A    Location Patient Served  [facility]    Encounter Setting CN site    Population Status Unhoused    Insurance Medicare    Insurance/Financial Assistance Referral N/A    Medication Have Medication Insecurities;Provided Medication Assistance    Medical Provider Yes    Screening Referrals Made N/A    Medical Referrals Made N/A    Medical Appointment Completed N/A    CNP Interventions Advocate/Support;Case Management    Screenings CN Performed N/A    ED Visit Averted N/A    Life-Saving Intervention Made N/A        Picked up albuterol  from [facility] pharmacy per MD request and brought to [facility]. Client could not be located. Left medication at [facility] front desk with client's name and bed number. Saint Hank W RN CN

## 2024-10-21 ENCOUNTER — Encounter: Payer: Self-pay | Admitting: *Deleted

## 2024-10-21 NOTE — Congregational Nurse Program (Signed)
°  Dept: [phone]   Congregational Nurse Program Note  Date of Encounter: 10/21/2024  Past Medical History: Past Medical History:  Diagnosis Date   Hypertension    Morbid obesity [facility])     Encounter Details:  Community Questionnaire - 10/21/24 1400       Questionnaire   Ask client: Do you give verbal consent for me to treat you today? Yes    Student Assistance N/A    Location Patient Served  [facility]    Encounter Setting CN site    Population Status Unhoused    Insurance Medicare    Insurance/Financial Assistance Referral N/A    Medication N/A    Medical Provider Yes    Screening Referrals Made N/A    Medical Referrals Made N/A    Medical Appointment Completed N/A    CNP Interventions Advocate/Support;Case Management;Navigate Healthcare System    Screenings CN Performed N/A    ED Visit Averted N/A    Life-Saving Intervention Made N/A         Client came to nurse's office to pick up tennis shoes and compression socks from [facility] clinic. Information written and verbal about upcoming PCP appt was given to client. Sada Mazzoni W RN CN

## 2024-11-17 ENCOUNTER — Ambulatory Visit

## 2024-11-17 VITALS — BP 132/82 | HR 74 | Temp 98.1°F | Resp 20 | Ht 75.0 in | Wt >= 6400 oz

## 2024-11-17 DIAGNOSIS — R600 Localized edema: Secondary | ICD-10-CM | POA: Diagnosis not present

## 2024-11-17 DIAGNOSIS — R0989 Other specified symptoms and signs involving the circulatory and respiratory systems: Secondary | ICD-10-CM | POA: Diagnosis not present

## 2024-11-17 DIAGNOSIS — Z7689 Persons encountering health services in other specified circumstances: Secondary | ICD-10-CM

## 2024-11-17 DIAGNOSIS — L209 Atopic dermatitis, unspecified: Secondary | ICD-10-CM | POA: Diagnosis not present

## 2024-11-17 MED ORDER — FUROSEMIDE 40 MG PO TABS: 40.0000 mg | ORAL_TABLET | Freq: Every day | ORAL | 1 refills | Status: AC

## 2024-11-17 MED ORDER — TRIAMCINOLONE ACETONIDE 0.1 % EX CREA: 1.0000 | TOPICAL_CREAM | Freq: Two times a day (BID) | CUTANEOUS | 1 refills | Status: AC

## 2024-11-17 MED ORDER — AZITHROMYCIN 250 MG PO TABS: ORAL_TABLET | ORAL | 0 refills | Status: AC

## 2024-11-17 NOTE — Progress Notes (Signed)
" ° ° ° °  Patient ID: Cody Mah., male    DOB: 10/11/1961  MRN: [id number]  CC: Establish Care (Patient is here to established care with provider./~health hx address/~care gaps address/ )   Subjective: Cody [name] is a 64 y.o. male with past medical history of hypertension who presents to clinic to establish care. Patient reports that he experienced body aches and head congestion.  But he continues to experience cough and has an increased trouble breathing at night due to congestion.  Denies fever.  Has been taking Teraflu for symptom relief. Additionally he reports he ran out of Lasix  months ago. Has not take blood pressure medication since June.    Allergies[1]  ROS: Review of Systems Negative except as stated above  PHYSICAL EXAM: BP 132/82   Pulse 74   Temp 98.1 F (36.7 C) (Oral)   Resp 20   Ht 6' 3 (1.905 m)   Wt (!) 580 lb 12.8 oz (263.4 kg)   SpO2 93%   BMI 72.60 kg/m   Physical Exam  General: well-appearing, no acute distress Skin: no jaundice, rashes, or lesions ENT: Mild posterior oropharynx erythema, no exudates.  Bilateral translucent tympanic membranes Cardiovascular: regular heart rate and rhythm, normal S1/S2, no murmurs, gallops, or rubs, peripheral pulses 2+ bilaterally Chest: no skeletal deformity, turbulent flow of air left middle lobe via auscultation Abdomen: soft, non-distended, non-tender to palpation, no hepatomegaly, no splenomegaly, normoactive bowel sounds Musculoskeletal: normal gait Extremities: no peripheral edema  ASSESSMENT AND PLAN:  1. Encounter to establish care with new provider (Primary)  2. Atopic dermatitis, unspecified type - Continue triamcinolone  cream (KENALOG ) 0.1 %; Apply 1 Application topically 2 (two) times daily.  Dispense: 453 g; Refill: 1  3. Lower extremity edema - Restart furosemide  (LASIX ) 40 MG tablet; Take 1 tablet (40 mg total) by mouth daily.  Dispense: 90 tablet; Refill: 1  4. Chest  congestion -Given ongoing symptoms will start azithromycin  (ZITHROMAX ) 250 MG tablet; Take 2 tablets (500mg ) on day one. Take (250mg ) 1 tablet daily for days 2-5  Dispense: 6 tablet; Refill: 0   Patient was given the opportunity to ask questions.  Patient verbalized understanding of the plan and was able to repeat key elements of the plan.    No orders of the defined types were placed in this encounter.    Requested Prescriptions   Signed Prescriptions Disp Refills   triamcinolone  cream (KENALOG ) 0.1 % 453 g 1    Sig: Apply 1 Application topically 2 (two) times daily.   furosemide  (LASIX ) 40 MG tablet 90 tablet 1    Sig: Take 1 tablet (40 mg total) by mouth daily.   azithromycin  (ZITHROMAX ) 250 MG tablet 6 tablet 0    Sig: Take 2 tablets (500mg ) on day one. Take (250mg ) 1 tablet daily for days 2-5    Return in about 1 month (around 12/18/2024) for physical, labs.  Sula Cower Luanna Weesner, PA-C      [1] No Known Allergies  "

## 2024-11-29 ENCOUNTER — Emergency Department (HOSPITAL_COMMUNITY)

## 2024-11-29 ENCOUNTER — Emergency Department (HOSPITAL_COMMUNITY)
Admission: EM | Admit: 2024-11-29 | Discharge: 2024-11-29 | Disposition: A | Discharge status: Home / Self Care | Attending: Emergency Medicine | Admitting: Emergency Medicine

## 2024-11-29 ENCOUNTER — Other Ambulatory Visit: Payer: Self-pay

## 2024-11-29 ENCOUNTER — Encounter (HOSPITAL_COMMUNITY): Payer: Self-pay

## 2024-11-29 DIAGNOSIS — M25511 Pain in right shoulder: Secondary | ICD-10-CM | POA: Diagnosis present

## 2024-11-29 DIAGNOSIS — Z79899 Other long term (current) drug therapy: Secondary | ICD-10-CM | POA: Insufficient documentation

## 2024-11-29 MED ORDER — OXYCODONE-ACETAMINOPHEN 5-325 MG PO TABS
1.0000 | ORAL_TABLET | Freq: Once | ORAL | Status: AC
  Administered 2024-11-29: 1 via ORAL
  Filled 2024-11-29: qty 1

## 2024-11-29 MED ORDER — KETOROLAC TROMETHAMINE 15 MG/ML IJ SOLN
15.0000 mg | Freq: Once | INTRAMUSCULAR | Status: AC
  Administered 2024-11-29: 15 mg via INTRAMUSCULAR
  Filled 2024-11-29: qty 1

## 2024-11-29 MED ORDER — OXYCODONE-ACETAMINOPHEN 5-325 MG PO TABS: 1.0000 | ORAL_TABLET | Freq: Four times a day (QID) | ORAL | 0 refills | Status: AC | PRN

## 2024-11-29 NOTE — ED Notes (Signed)
 Provided pt with refreshments to go

## 2024-11-29 NOTE — ED Triage Notes (Signed)
 Patient has had left shoulder pain since 7pm yesterday. Denies falls. 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

## 2024-11-29 NOTE — ED Notes (Signed)
 Spoke to [employer] [facility], will call back to confirm he can transport pt to shelter [facility] [place] [facility] [address]

## 2024-11-29 NOTE — Progress Notes (Signed)
 Orthopedic Tech Progress Note Patient Details:  Cody [name]. 03-20-1961 [id number] Applied sling immobilizer per order.  Ortho Devices Type of Ortho Device: Sling immobilizer Ortho Device/Splint Location: LUE Ortho Device/Splint Interventions: Ordered, Application, Adjustment   Post Interventions Patient Tolerated: Well Instructions Provided: Adjustment of device, Care of device, Poper ambulation with device  Morna Pink 11/29/2024, 10:52 AM

## 2024-11-29 NOTE — Discharge Instructions (Signed)
 Please use Tylenol or ibuprofen for pain.  You may use 600 mg ibuprofen every 6 hours or 1000 mg of Tylenol every 6 hours.  You may choose to alternate between the 2.  This would be most effective.  Not to exceed 4 g of Tylenol within 24 hours.  Not to exceed 3200 mg ibuprofen 24 hours.  You can use the stronger narcotic pain medication in place of Tylenol for severe break through pain.  If you take the narcotic pain medication that we prescribed recommend that you also take a laxative such as MiraLAX or Dulcolax every day that you take the narcotic pain medicine, and drink plenty of fluids, 50 to 64 ounces to prevent any constipation.

## 2024-11-29 NOTE — ED Provider Notes (Signed)
 " [place] EMERGENCY DEPARTMENT AT [facility] Provider Note   CSN: [id number] Arrival date & time: 11/29/24  9144     Patient presents with: Shoulder Pain   Cody Hora. is a 64 y.o. male past medical history significant for obesity, arthritis who presents concern for left shoulder pain.  Denies any falls.  He is a CNA and lifts patients daily.  Rates pain 8/10.  Difficult to move left shoulder at all without significant pain.  Nothing for pain prior to arrival.    Shoulder Pain      Prior to Admission medications  Medication Sig Start Date End Date Taking? Authorizing Provider  albuterol  (VENTOLIN  HFA) 108 (90 Base) MCG/ACT inhaler Inhale 2 puffs into the lungs every 6 (six) hours as needed for wheezing or shortness of breath. 10/06/24  Yes Barrett, Shona MATSU, PA-C  furosemide  (LASIX ) 40 MG tablet Take 1 tablet (40 mg total) by mouth daily. 11/17/24  Yes Leavy Lucas Fox, PA-C  oxyCODONE -acetaminophen  (PERCOCET/ROXICET) 5-325 MG tablet Take 1 tablet by mouth every 6 (six) hours as needed for severe pain (pain score 7-10). 11/29/24  Yes Sharel Behne H, PA-C  triamcinolone  cream (KENALOG ) 0.1 % Apply 1 Application topically 2 (two) times daily. 11/17/24  Yes Leavy Lucas Fox, PA-C  azithromycin  (ZITHROMAX ) 250 MG tablet Take 2 tablets (500mg ) on day one. Take (250mg ) 1 tablet daily for days 2-5 Patient not taking: Reported on 11/29/2024 11/17/24   Leavy Lucas Fox, PA-C    Allergies: Patient has no known allergies.    Review of Systems  All other systems reviewed and are negative.   Updated Vital Signs BP (!) 196/107 (BP Location: Right Arm)   Pulse 80   Temp 97.9 F (36.6 C) (Oral)   Resp 18   Ht 6' 3 (1.905 m)   Wt (!) 235 kg   SpO2 97%   BMI 64.75 kg/m   Physical Exam Vitals and nursing note reviewed.  Constitutional:      General: He is not in acute distress.    Appearance: Normal appearance.  HENT:     Head: Normocephalic and  atraumatic.  Eyes:     General:        Right eye: No discharge.        Left eye: No discharge.  Cardiovascular:     Rate and Rhythm: Normal rate and regular rhythm.  Pulmonary:     Effort: Pulmonary effort is normal. No respiratory distress.  Musculoskeletal:        General: No deformity.     Comments: Tenderness to palpation of the left shoulder at the humeral head.  No tenderness into the left cervical paraspinous muscles, left clavicle.  Decreased range of motion of flexion, extension, abduction, adduction secondary to pain.  No obvious step-off or deformity.  Skin:    General: Skin is warm and dry.  Neurological:     Mental Status: He is alert and oriented to person, place, and time.  Psychiatric:        Mood and Affect: Mood normal.        Behavior: Behavior normal.     (all labs ordered are listed, but only abnormal results are displayed) Labs Reviewed - No data to display  EKG: None  Radiology: DG Shoulder Left Result Date: 11/29/2024 EXAM: 2 VIEW(S) XRAY OF THE LEFT SHOULDER 11/29/2024 09:28:00 AM COMPARISON: None available. CLINICAL HISTORY: 64 year old male with left shoulder pain with no known injury since 19:00. FINDINGS: BONES  AND JOINTS: Glenohumeral joint is normally aligned. No acute osseous abnormality. No malalignment. The [facility] joint is unremarkable. SOFT TISSUES: No abnormal calcifications. Visualized lung is unremarkable. IMPRESSION: 1. No acute osseous abnormality identified about the left shoulder. Electronically signed by: Helayne Hurst MD 11/29/2024 09:48 AM EST RP Workstation: [id number]     Procedures   Medications Ordered in the ED  ketorolac  (TORADOL ) 15 MG/ML injection 15 mg (has no administration in time range)  oxyCODONE -acetaminophen  (PERCOCET/ROXICET) 5-325 MG per tablet 1 tablet (has no administration in time range)                                    Medical Decision Making Amount and/or Complexity of Data Reviewed Radiology:  ordered.   This patient is a 64 y.o. male who presents to the ED for concern of shoulder pain.   Differential diagnoses prior to evaluation: Fracture, dislocation, strain, sprain, rotator cuff injury, frozen shoulder, versus other  Past Medical History / Social History / Additional history: Chart reviewed. Pertinent results include: obesity, arthritis  Physical Exam: Physical exam performed. The pertinent findings include: Tenderness to palpation of the left shoulder at the humeral head.  No tenderness into the left cervical paraspinous muscles, left clavicle.  Decreased range of motion of flexion, extension, abduction, adduction secondary to pain.  No obvious step-off or deformity.   Radial, ulnar pulses are 2+ in the affected left upper extremity.  I independently interpreted plain film radiography of the left shoulder which shows no evidence of acute abnormality  Medications / Treatment: Toradol , Percocet in the emergency department, provided with arm sling.  Discharged with short course of pain medicine, close orthopedic follow-up   Disposition: After consideration of the diagnostic results and the patients response to treatment, I feel that patient is stable for discharge with plan as above.   emergency department workup does not suggest an emergent condition requiring admission or immediate intervention beyond what has been performed at this time. The plan is: as above. The patient is safe for discharge and has been instructed to return immediately for worsening symptoms, change in symptoms or any other concerns.   Final diagnoses:  Acute pain of right shoulder    ED Discharge Orders          Ordered    oxyCODONE -acetaminophen  (PERCOCET/ROXICET) 5-325 MG tablet  Every 6 hours PRN        11/29/24 1023               [place], [place] H, PA-C 11/29/24 1023    Horton, Kristie M, DO 11/29/24 1535  "

## 2024-12-20 ENCOUNTER — Ambulatory Visit: Payer: Self-pay
